# Patient Record
Sex: Female | Born: 1935 | Race: White | Hispanic: No | Smoking: Former smoker
Health system: Southern US, Community
[De-identification: ages and names within clinical notes are randomized; demographics above are authoritative.]

## PROBLEM LIST (undated history)

## (undated) DIAGNOSIS — M1990 Unspecified osteoarthritis, unspecified site: Secondary | ICD-10-CM

## (undated) DIAGNOSIS — C439 Malignant melanoma of skin, unspecified: Secondary | ICD-10-CM

## (undated) DIAGNOSIS — E7800 Pure hypercholesterolemia, unspecified: Secondary | ICD-10-CM

## (undated) DIAGNOSIS — C801 Malignant (primary) neoplasm, unspecified: Secondary | ICD-10-CM

## (undated) DIAGNOSIS — E049 Nontoxic goiter, unspecified: Secondary | ICD-10-CM

## (undated) DIAGNOSIS — I10 Essential (primary) hypertension: Secondary | ICD-10-CM

## (undated) DIAGNOSIS — F32A Depression, unspecified: Secondary | ICD-10-CM

## (undated) DIAGNOSIS — I499 Cardiac arrhythmia, unspecified: Secondary | ICD-10-CM

## (undated) DIAGNOSIS — F329 Major depressive disorder, single episode, unspecified: Secondary | ICD-10-CM

## (undated) HISTORY — PX: WISDOM TOOTH EXTRACTION: SHX21

## (undated) HISTORY — PX: CATARACT EXTRACTION W/ INTRAOCULAR LENS IMPLANT: SHX1309

## (undated) HISTORY — PX: TONSILLECTOMY: SUR1361

## (undated) HISTORY — PX: COLONOSCOPY: SHX174

## (undated) HISTORY — PX: MELANOMA EXCISION: SHX5266

---

## 1990-04-10 DIAGNOSIS — C801 Malignant (primary) neoplasm, unspecified: Secondary | ICD-10-CM

## 1990-04-10 HISTORY — DX: Malignant (primary) neoplasm, unspecified: C80.1

## 1990-04-10 HISTORY — PX: BREAST LUMPECTOMY: SHX2

## 1998-05-11 ENCOUNTER — Other Ambulatory Visit: Admission: RE | Admit: 1998-05-11 | Discharge: 1998-05-11 | Payer: Self-pay | Admitting: Gynecology

## 1999-10-31 ENCOUNTER — Other Ambulatory Visit: Admission: RE | Admit: 1999-10-31 | Discharge: 1999-10-31 | Payer: Self-pay | Admitting: Gynecology

## 2000-04-10 HISTORY — PX: BACK SURGERY: SHX140

## 2000-06-22 ENCOUNTER — Encounter: Admission: RE | Admit: 2000-06-22 | Discharge: 2000-06-22 | Payer: Self-pay | Admitting: Specialist

## 2000-06-22 ENCOUNTER — Encounter: Payer: Self-pay | Admitting: Specialist

## 2001-02-04 ENCOUNTER — Other Ambulatory Visit: Admission: RE | Admit: 2001-02-04 | Discharge: 2001-02-04 | Payer: Self-pay | Admitting: Gynecology

## 2001-07-19 ENCOUNTER — Encounter: Payer: Self-pay | Admitting: Internal Medicine

## 2001-07-19 ENCOUNTER — Encounter: Admission: RE | Admit: 2001-07-19 | Discharge: 2001-07-19 | Payer: Self-pay | Admitting: Internal Medicine

## 2002-02-06 ENCOUNTER — Other Ambulatory Visit: Admission: RE | Admit: 2002-02-06 | Discharge: 2002-02-06 | Payer: Self-pay | Admitting: Gynecology

## 2002-02-19 ENCOUNTER — Encounter: Payer: Self-pay | Admitting: Internal Medicine

## 2002-02-19 ENCOUNTER — Ambulatory Visit (HOSPITAL_COMMUNITY): Admission: RE | Admit: 2002-02-19 | Discharge: 2002-02-19 | Payer: Self-pay | Admitting: Internal Medicine

## 2002-06-20 ENCOUNTER — Ambulatory Visit (HOSPITAL_COMMUNITY): Admission: RE | Admit: 2002-06-20 | Discharge: 2002-06-20 | Payer: Self-pay | Admitting: *Deleted

## 2003-03-17 ENCOUNTER — Other Ambulatory Visit: Admission: RE | Admit: 2003-03-17 | Discharge: 2003-03-17 | Payer: Self-pay | Admitting: Gynecology

## 2005-07-05 ENCOUNTER — Encounter: Admission: RE | Admit: 2005-07-05 | Discharge: 2005-07-05 | Payer: Self-pay | Admitting: Internal Medicine

## 2006-05-05 ENCOUNTER — Encounter: Admission: RE | Admit: 2006-05-05 | Discharge: 2006-05-05 | Payer: Self-pay | Admitting: Internal Medicine

## 2007-11-01 ENCOUNTER — Ambulatory Visit (HOSPITAL_COMMUNITY): Admission: RE | Admit: 2007-11-01 | Discharge: 2007-11-01 | Payer: Self-pay | Admitting: *Deleted

## 2007-11-01 ENCOUNTER — Encounter (INDEPENDENT_AMBULATORY_CARE_PROVIDER_SITE_OTHER): Payer: Self-pay | Admitting: *Deleted

## 2009-06-15 ENCOUNTER — Encounter: Admission: RE | Admit: 2009-06-15 | Discharge: 2009-06-15 | Payer: Self-pay | Admitting: Internal Medicine

## 2009-07-06 ENCOUNTER — Other Ambulatory Visit: Admission: RE | Admit: 2009-07-06 | Discharge: 2009-07-06 | Payer: Self-pay | Admitting: Interventional Radiology

## 2009-07-06 ENCOUNTER — Encounter: Admission: RE | Admit: 2009-07-06 | Discharge: 2009-07-06 | Payer: Self-pay | Admitting: Internal Medicine

## 2009-08-03 ENCOUNTER — Ambulatory Visit (HOSPITAL_COMMUNITY): Admission: RE | Admit: 2009-08-03 | Discharge: 2009-08-03 | Payer: Self-pay | Admitting: Endocrinology

## 2010-05-04 ENCOUNTER — Other Ambulatory Visit: Payer: Self-pay | Admitting: Dermatology

## 2010-08-04 ENCOUNTER — Other Ambulatory Visit: Payer: Self-pay | Admitting: Endocrinology

## 2010-08-04 DIAGNOSIS — E049 Nontoxic goiter, unspecified: Secondary | ICD-10-CM

## 2010-08-08 ENCOUNTER — Ambulatory Visit
Admission: RE | Admit: 2010-08-08 | Discharge: 2010-08-08 | Disposition: A | Discharge status: Home / Self Care | Payer: Medicare Other | Source: Ambulatory Visit | Attending: Endocrinology | Admitting: Endocrinology

## 2010-08-08 DIAGNOSIS — E049 Nontoxic goiter, unspecified: Secondary | ICD-10-CM

## 2010-08-23 NOTE — Op Note (Signed)
NAME:  Andrea Vasquez, Andrea Vasquez           ACCOUNT NO.:  [id number]   MEDICAL RECORD NO.:  [id number]          PATIENT TYPE:  AMB   LOCATION:  ENDO                         FACILITY:  [facility]   PHYSICIAN:  Georgiana Spinner, M.D.    DATE OF BIRTH:  [birth year]   DATE OF PROCEDURE:  DATE OF DISCHARGE:                               OPERATIVE REPORT   PROCEDURE:  Colonoscopy.   INDICATIONS:  Colon cancer screening.   ANESTHESIA:  Fentanyl 75 mcg, Versed 7.5 mg.   DESCRIPTION OF PROCEDURE:  With the patient mildly sedated in the left  lateral decubitus position, the Pentax videoscopic colonoscope was  inserted in the rectum and passed under direct vision to the cecum,  identified by the ileocecal valve and appendiceal orifice, both of which  were photographed.  From this point, the colonoscope was slowly  withdrawn, taking circumferential views of the colonic mucosa as we  passed through the sigmoid colon, which was thickened with  diverticulosis until we reached the rectum which showed a small polyp  which was photographed and removed using biopsy forceps.  We then placed  the endoscope in retroflexed view to view the anal canal from above.  Internal hemorrhoids were noted.  The endoscope was straightened and  withdrawn.  The patient's vital signs and pulse oximeter remained  stable.  The patient tolerated the procedure well without apparent  complications.   FINDINGS:  Thickening of the sigmoid colon with diverticulosis noted.  Internal hemorrhoids.  Small polyp in the rectum, biopsied.  Await  biopsy report.  The patient will call me for results and follow-up with  me as needed as an outpatient.           ______________________________  Georgiana Spinner, M.D.     GMO/MEDQ  D:  11/01/2007  T:  11/01/2007  Job:  [id number]

## 2010-08-26 NOTE — Op Note (Signed)
   NAME:  Andrea Vasquez, Andrea Vasquez                     ACCOUNT NO.:  [id number]   MEDICAL RECORD NO.:  [id number]                   PATIENT TYPE:  AMB   LOCATION:  ENDO                                 FACILITY:  [facility]   PHYSICIAN:  Georgiana Spinner, M.D.                 DATE OF BIRTH:  [birth year]   DATE OF PROCEDURE:  DATE OF DISCHARGE:                                 OPERATIVE REPORT   PROCEDURE:  Colonoscopy.   INDICATIONS FOR PROCEDURE:  Colon cancer screening.   ANESTHESIA:  Demerol 50, Versed 6 mg.   DESCRIPTION OF PROCEDURE:  With the patient mildly sedated in the left  lateral decubitus position, the Olympus videoscopic colonoscope was inserted  in the rectum and passed under direct vision to the cecum identified by the  ileocecal valve and appendiceal orifice both of which were photographed.  From this point, the colonoscope was slowly withdrawn taking circumferential  views of the entire colonic mucosa stopping only to photograph diverticula  along the way until we reached the rectum which appeared normal on direct  and showed hemorrhoids on retroflexed view. The endoscope was straightened  and withdrawn. The patient's vital signs and pulse oximeter remained stable.  The patient tolerated the procedure well without apparent complications.   FINDINGS:  Internal hemorrhoids, diverticulosis of the sigmoid colon;  otherwise, unremarkable examination.   PLAN:  Repeat examination in approximately five years.                                                 Georgiana Spinner, M.D.    GMO/MEDQ  D:  06/20/2002  T:  06/20/2002  Job:  [id number]

## 2011-06-01 ENCOUNTER — Other Ambulatory Visit: Payer: Self-pay | Admitting: Internal Medicine

## 2011-06-01 DIAGNOSIS — E042 Nontoxic multinodular goiter: Secondary | ICD-10-CM

## 2011-07-24 ENCOUNTER — Ambulatory Visit
Admission: RE | Admit: 2011-07-24 | Discharge: 2011-07-24 | Disposition: A | Discharge status: Home / Self Care | Payer: Medicare Other | Source: Ambulatory Visit | Attending: Internal Medicine | Admitting: Internal Medicine

## 2011-07-24 DIAGNOSIS — E042 Nontoxic multinodular goiter: Secondary | ICD-10-CM

## 2011-08-04 ENCOUNTER — Other Ambulatory Visit: Payer: Medicare Other

## 2011-11-27 ENCOUNTER — Other Ambulatory Visit: Payer: Self-pay

## 2012-03-13 ENCOUNTER — Other Ambulatory Visit: Payer: Self-pay | Admitting: Dermatology

## 2012-06-17 ENCOUNTER — Other Ambulatory Visit: Payer: Self-pay | Admitting: Dermatology

## 2012-08-01 ENCOUNTER — Other Ambulatory Visit: Payer: Self-pay | Admitting: Gastroenterology

## 2012-09-11 ENCOUNTER — Other Ambulatory Visit: Payer: Self-pay | Admitting: Endocrinology

## 2012-09-11 DIAGNOSIS — E049 Nontoxic goiter, unspecified: Secondary | ICD-10-CM

## 2013-03-20 ENCOUNTER — Other Ambulatory Visit: Payer: Self-pay | Admitting: Dermatology

## 2013-03-24 ENCOUNTER — Ambulatory Visit
Admission: RE | Admit: 2013-03-24 | Discharge: 2013-03-24 | Disposition: A | Discharge status: Home / Self Care | Payer: Medicare PPO | Source: Ambulatory Visit | Attending: Endocrinology | Admitting: Endocrinology

## 2013-03-24 DIAGNOSIS — E049 Nontoxic goiter, unspecified: Secondary | ICD-10-CM

## 2013-04-22 ENCOUNTER — Other Ambulatory Visit: Payer: Self-pay | Admitting: Orthopedic Surgery

## 2013-05-06 ENCOUNTER — Encounter (HOSPITAL_COMMUNITY): Payer: Self-pay | Admitting: Pharmacy Technician

## 2013-05-08 ENCOUNTER — Encounter (HOSPITAL_COMMUNITY)
Admission: RE | Admit: 2013-05-08 | Discharge: 2013-05-08 | Disposition: A | Discharge status: Home / Self Care | Payer: Medicare Other | Source: Ambulatory Visit | Attending: Orthopedic Surgery | Admitting: Orthopedic Surgery

## 2013-05-08 ENCOUNTER — Encounter (HOSPITAL_COMMUNITY): Payer: Self-pay

## 2013-05-08 ENCOUNTER — Ambulatory Visit (HOSPITAL_COMMUNITY)
Admission: RE | Admit: 2013-05-08 | Discharge: 2013-05-08 | Disposition: A | Discharge status: Home / Self Care | Payer: Medicare Other | Source: Ambulatory Visit | Attending: Orthopedic Surgery | Admitting: Orthopedic Surgery

## 2013-05-08 DIAGNOSIS — M899 Disorder of bone, unspecified: Secondary | ICD-10-CM | POA: Insufficient documentation

## 2013-05-08 DIAGNOSIS — M47814 Spondylosis without myelopathy or radiculopathy, thoracic region: Secondary | ICD-10-CM | POA: Insufficient documentation

## 2013-05-08 DIAGNOSIS — M949 Disorder of cartilage, unspecified: Secondary | ICD-10-CM

## 2013-05-08 DIAGNOSIS — Z0183 Encounter for blood typing: Secondary | ICD-10-CM | POA: Insufficient documentation

## 2013-05-08 DIAGNOSIS — Z01818 Encounter for other preprocedural examination: Secondary | ICD-10-CM | POA: Insufficient documentation

## 2013-05-08 DIAGNOSIS — Z01812 Encounter for preprocedural laboratory examination: Secondary | ICD-10-CM | POA: Insufficient documentation

## 2013-05-08 DIAGNOSIS — M1610 Unilateral primary osteoarthritis, unspecified hip: Secondary | ICD-10-CM | POA: Insufficient documentation

## 2013-05-08 DIAGNOSIS — M169 Osteoarthritis of hip, unspecified: Secondary | ICD-10-CM | POA: Insufficient documentation

## 2013-05-08 DIAGNOSIS — M76899 Other specified enthesopathies of unspecified lower limb, excluding foot: Secondary | ICD-10-CM | POA: Insufficient documentation

## 2013-05-08 HISTORY — DX: Nontoxic goiter, unspecified: E04.9

## 2013-05-08 HISTORY — DX: Unspecified osteoarthritis, unspecified site: M19.90

## 2013-05-08 HISTORY — DX: Depression, unspecified: F32.A

## 2013-05-08 HISTORY — DX: Cardiac arrhythmia, unspecified: I49.9

## 2013-05-08 HISTORY — DX: Pure hypercholesterolemia, unspecified: E78.00

## 2013-05-08 HISTORY — DX: Malignant (primary) neoplasm, unspecified: C80.1

## 2013-05-08 HISTORY — DX: Essential (primary) hypertension: I10

## 2013-05-08 HISTORY — DX: Major depressive disorder, single episode, unspecified: F32.9

## 2013-05-08 LAB — COMPREHENSIVE METABOLIC PANEL
ALK PHOS: 68 U/L (ref 39–117)
ALT: 23 U/L (ref 0–35)
AST: 17 U/L (ref 0–37)
Albumin: 3.9 g/dL (ref 3.5–5.2)
BILIRUBIN TOTAL: 0.6 mg/dL (ref 0.3–1.2)
BUN: 11 mg/dL (ref 6–23)
CHLORIDE: 103 meq/L (ref 96–112)
CO2: 27 meq/L (ref 19–32)
Calcium: 9.7 mg/dL (ref 8.4–10.5)
Creatinine, Ser: 0.53 mg/dL (ref 0.50–1.10)
GFR calc Af Amer: >90 mL/min (ref >90)
GFR calc non Af Amer: >90 mL/min (ref >90)
GLUCOSE: 99 mg/dL (ref 70–99)
POTASSIUM: 4.1 meq/L (ref 3.7–5.3)
Sodium: 142 mEq/L (ref 137–147)
Total Protein: 7.4 g/dL (ref 6.0–8.3)

## 2013-05-08 LAB — CBC
HCT: 44.1 % (ref 36.0–46.0)
Hemoglobin: 14.2 g/dL (ref 12.0–15.0)
MCH: 31.4 pg (ref 26.0–34.0)
MCHC: 32.2 g/dL (ref 30.0–36.0)
MCV: 97.6 fL (ref 78.0–100.0)
Platelets: 321 10*3/uL (ref 150–400)
RBC: 4.52 MIL/uL (ref 3.87–5.11)
RDW: 12.8 % (ref 11.5–15.5)
WBC: 10.4 10*3/uL (ref 4.0–10.5)

## 2013-05-08 LAB — PROTIME-INR
INR: 0.92 (ref 0.00–1.49)
PROTHROMBIN TIME: 12.2 s (ref 11.6–15.2)

## 2013-05-08 LAB — ABO/RH: ABO/RH(D): O POS

## 2013-05-08 LAB — URINALYSIS, ROUTINE W REFLEX MICROSCOPIC
Bilirubin Urine: NEGATIVE
Glucose, UA: NEGATIVE mg/dL
Hgb urine dipstick: NEGATIVE
Ketones, ur: NEGATIVE mg/dL
LEUKOCYTES UA: NEGATIVE
NITRITE: NEGATIVE
PH: 5.5 (ref 5.0–8.0)
Protein, ur: NEGATIVE mg/dL
SPECIFIC GRAVITY, URINE: 1.02 (ref 1.005–1.030)
Urobilinogen, UA: 0.2 mg/dL (ref 0.0–1.0)

## 2013-05-08 LAB — SURGICAL PCR SCREEN
MRSA, PCR: NEGATIVE
Staphylococcus aureus: NEGATIVE

## 2013-05-08 LAB — APTT: APTT: 31 s (ref 24–37)

## 2013-05-08 NOTE — Progress Notes (Signed)
EKG 02/27/13 on chart, surgery clearance note 02/27/13 Dr. Shelia Media on chart

## 2013-05-08 NOTE — Patient Instructions (Addendum)
Andrea Vasquez  05/08/2013   Your procedure is scheduled on: 05/14/13  Report to [facility] at 11:00 AM.  Call this number if you have problems the morning of surgery 336-: [phone]   Remember: NO VISITORS UNDER AGE 75 PER [place].   Do not eat food After Midnight, clear liquids from midnight until 7:30am on 05/14/13 then nothing.    Do not wear jewelry, make-up or nail polish.  Do not wear lotions, powders, or perfumes. You may wear deodorant.  Do not shave 48 hours prior to surgery. Men may shave face and neck.  Do not bring valuables to the hospital.  Contacts, dentures or bridgework may not be worn into surgery.  Leave suitcase in the car. After surgery it may be brought to your room.  For patients admitted to the hospital, checkout time is 11:00 AM the day of discharge.   Please read over the following fact sheets that you were given: MRSA Information, incentive spirometry fact sheet, blood fact sheet Paulette Blanch, RN  pre op nurse call if needed [phone]    FAILURE TO [place]   Patient Signature: ___________________________________________

## 2013-05-09 ENCOUNTER — Other Ambulatory Visit: Payer: Self-pay | Admitting: Surgical

## 2013-05-09 NOTE — H&P (Signed)
TOTAL HIP ADMISSION H&P  Patient is admitted for right total hip arthroplasty.  Subjective:  Chief Complaint: right hip pain  HPI: [place], 78 y.o. female, has a history of pain and functional disability in the right hip(s) due to arthritis and patient has failed non-surgical conservative treatments for greater than 12 weeks to include NSAID's and/or analgesics, corticosteriod injections, use of assistive devices and activity modification.  Onset of symptoms was gradual starting 2 years ago with gradually worsening course since that time.The patient noted no past surgery on the right hip(s).  Patient currently rates pain in the right hip at 7 out of 10 with activity. Patient has night pain, worsening of pain with activity and weight bearing, pain that interfers with activities of daily living, pain with passive range of motion and crepitus. Patient has evidence of periarticular osteophytes and joint space narrowing by imaging studies. This condition presents safety issues increasing the risk of falls.  There is no current active infection.  Past Medical History  Diagnosis Date  . Hypercholesteremia     controlled by medicine  . Hypertension   . Goiter     small on right side, monitored  . Depression     mild  . Arthritis   . Cancer 1992    right breast  . Dysrhythmia     "heart fluttering-been checked out, nothing wrong"    Past Surgical History  Procedure Laterality Date  . Breast lumpectomy Right 1992  . Back surgery  2002    lumbar, herniated disc  . Tonsillectomy  age 46     Current outpatient prescriptions: acetaminophen (TYLENOL) 650 MG CR tablet, Take 1,300 mg by mouth 2 (two) times daily., Disp: , Rfl: ;   losartan (COZAAR) 100 MG tablet, Take 100 mg by mouth every evening., Disp: , Rfl: ;   mirtazapine (REMERON) 15 MG tablet, Take 15 mg by mouth at bedtime., Disp: , Rfl: ;   Multiple Vitamins-Minerals (PRESERVISION AREDS PO), Take 1 tablet by mouth daily.,  Disp: , Rfl:  rosuvastatin (CRESTOR) 5 MG tablet, Take 5 mg by mouth every evening., Disp: , Rfl: ;   traMADol (ULTRAM) 50 MG tablet, Take 50 mg by mouth every 6 (six) hours as needed for moderate pain., Disp: , Rfl:   No Known Allergies  History  Substance Use Topics  . Smoking status: Former Smoker -- 0.25 packs/day for 10 years    Types: Cigarettes    Quit date: 04/10/1968  . Smokeless tobacco: Never Used  . Alcohol Use: Yes     Comment: glass of wine occasional    Family History Father: deceased from cancer Mother: deceased from cancer Aunt: deceased from cancer Sister: deceased from cancer  Review of Systems  Constitutional: Negative.   HENT: Negative.   Eyes: Negative.   Respiratory: Negative.   Cardiovascular: Negative.   Gastrointestinal: Negative.   Genitourinary: Positive for frequency. Negative for dysuria, urgency, hematuria and flank pain.  Musculoskeletal: Positive for joint pain. Negative for back pain, falls, myalgias and neck pain.       Right hip pain  Skin: Negative.   Neurological: Negative.   Endo/Heme/Allergies: Negative.   Psychiatric/Behavioral: Negative.     Objective:  Physical Exam  Constitutional: She is oriented to person, place, and time. She appears well-developed and well-nourished. No distress.  HENT:  Head: Normocephalic and atraumatic.  Right Ear: External ear normal.  Left Ear: External ear normal.  Nose: Nose normal.  Mouth/Throat: Oropharynx is clear and  moist.  Eyes: Conjunctivae and EOM are normal.  Neck: Normal range of motion. Neck supple.  Respiratory: Effort normal and breath sounds normal. No respiratory distress. She has no wheezes.  GI: Bowel sounds are normal. She exhibits no distension. There is no tenderness.  Musculoskeletal:       Right hip: She exhibits decreased range of motion, decreased strength and crepitus.       Left hip: Normal.       Right knee: Normal.       Left knee: Normal.       Right lower leg:  She exhibits no tenderness and no swelling.       Left lower leg: She exhibits no tenderness and no swelling.   Her left hip has normal range of motion without discomfort. Right hip flexion 90, no internal rotation, about 10 degrees of external rotation, 10-20 degrees of abduction. She does have pain on range of motion of that right hip.  Neurological: She is alert and oriented to person, place, and time. She has normal strength and normal reflexes. No sensory deficit.  Skin: No rash noted. She is not diaphoretic. No erythema.  Psychiatric: She has a normal mood and affect. Her behavior is normal.    Vitals Weight: 140 lb Height: 70 in Body Surface Area: 1.77 m Body Mass Index: 20.09 kg/m Pulse: 72 (Regular) BP: 124/87 (Sitting, Left Arm, Standard)  Imaging Review Plain radiographs demonstrate severe degenerative joint disease of the right hip(s). The bone quality appears to be good for age and reported activity level.  Assessment/Plan:  End stage arthritis, right hip(s)  The patient history, physical examination, clinical judgement of the provider and imaging studies are consistent with end stage degenerative joint disease of the right hip(s) and total hip arthroplasty is deemed medically necessary. The treatment options including medical management, injection therapy, arthroscopy and arthroplasty were discussed at length. The risks and benefits of total hip arthroplasty were presented and reviewed. The risks due to aseptic loosening, infection, stiffness, dislocation/subluxation,  thromboembolic complications and other imponderables were discussed.  The patient acknowledged the explanation, agreed to proceed with the plan and consent was signed. Patient is being admitted for inpatient treatment for surgery, pain control, PT, OT, prophylactic antibiotics, VTE prophylaxis, progressive ambulation and ADL's and discharge planning.The patient is planning to be discharged home with  home health services      [place], [place]

## 2013-05-13 DIAGNOSIS — M169 Osteoarthritis of hip, unspecified: Secondary | ICD-10-CM | POA: Diagnosis present

## 2013-05-14 ENCOUNTER — Encounter (HOSPITAL_COMMUNITY): Payer: Self-pay | Admitting: *Deleted

## 2013-05-14 ENCOUNTER — Inpatient Hospital Stay (HOSPITAL_COMMUNITY): Payer: Medicare Other

## 2013-05-14 ENCOUNTER — Encounter (HOSPITAL_COMMUNITY): Payer: Medicare Other | Admitting: Anesthesiology

## 2013-05-14 ENCOUNTER — Inpatient Hospital Stay (HOSPITAL_COMMUNITY)
Admission: RE | Admit: 2013-05-14 | Discharge: 2013-05-16 | DRG: 470 | Disposition: A | Discharge status: Home Health | Payer: Medicare Other | Source: Ambulatory Visit | Attending: Orthopedic Surgery | Admitting: Orthopedic Surgery

## 2013-05-14 ENCOUNTER — Inpatient Hospital Stay (HOSPITAL_COMMUNITY): Payer: Medicare Other | Admitting: Anesthesiology

## 2013-05-14 ENCOUNTER — Encounter (HOSPITAL_COMMUNITY)
Admission: RE | Disposition: A | Discharge status: Home Health | Payer: Self-pay | Source: Ambulatory Visit | Attending: Orthopedic Surgery

## 2013-05-14 DIAGNOSIS — E876 Hypokalemia: Secondary | ICD-10-CM | POA: Diagnosis not present

## 2013-05-14 DIAGNOSIS — I10 Essential (primary) hypertension: Secondary | ICD-10-CM | POA: Diagnosis present

## 2013-05-14 DIAGNOSIS — M169 Osteoarthritis of hip, unspecified: Secondary | ICD-10-CM | POA: Diagnosis present

## 2013-05-14 DIAGNOSIS — Z96649 Presence of unspecified artificial hip joint: Secondary | ICD-10-CM

## 2013-05-14 DIAGNOSIS — F329 Major depressive disorder, single episode, unspecified: Secondary | ICD-10-CM | POA: Diagnosis present

## 2013-05-14 DIAGNOSIS — E7800 Pure hypercholesterolemia, unspecified: Secondary | ICD-10-CM | POA: Diagnosis present

## 2013-05-14 DIAGNOSIS — F3289 Other specified depressive episodes: Secondary | ICD-10-CM | POA: Diagnosis present

## 2013-05-14 DIAGNOSIS — IMO0002 Reserved for concepts with insufficient information to code with codable children: Secondary | ICD-10-CM

## 2013-05-14 DIAGNOSIS — Z87891 Personal history of nicotine dependence: Secondary | ICD-10-CM

## 2013-05-14 DIAGNOSIS — M1610 Unilateral primary osteoarthritis, unspecified hip: Principal | ICD-10-CM | POA: Diagnosis present

## 2013-05-14 DIAGNOSIS — D62 Acute posthemorrhagic anemia: Secondary | ICD-10-CM | POA: Diagnosis not present

## 2013-05-14 HISTORY — PX: TOTAL HIP ARTHROPLASTY: SHX124

## 2013-05-14 LAB — TYPE AND SCREEN
ABO/RH(D): O POS
Antibody Screen: NEGATIVE

## 2013-05-14 SURGERY — ARTHROPLASTY, HIP, TOTAL, ANTERIOR APPROACH
Anesthesia: Spinal | Site: Hip | Laterality: Right

## 2013-05-14 MED ORDER — PROMETHAZINE HCL 25 MG/ML IJ SOLN: 6.2500 mg | INTRAMUSCULAR | Status: DC | PRN

## 2013-05-14 MED ORDER — DIPHENHYDRAMINE HCL 12.5 MG/5ML PO ELIX: 12.5000 mg | ORAL_SOLUTION | ORAL | Status: DC | PRN

## 2013-05-14 MED ORDER — SODIUM CHLORIDE 0.9 % IJ SOLN
INTRAMUSCULAR | Status: AC
  Filled 2013-05-14: qty 50

## 2013-05-14 MED ORDER — PHENOL 1.4 % MT LIQD
1.0000 | OROMUCOSAL | Status: DC | PRN
  Filled 2013-05-14: qty 177

## 2013-05-14 MED ORDER — FLEET ENEMA 7-19 GM/118ML RE ENEM: 1.0000 | ENEMA | Freq: Once | RECTAL | Status: AC | PRN

## 2013-05-14 MED ORDER — SODIUM CHLORIDE 0.9 % IJ SOLN
INTRAMUSCULAR | Status: DC | PRN
  Administered 2013-05-14: 14:00:00

## 2013-05-14 MED ORDER — ACETAMINOPHEN 650 MG RE SUPP: 650.0000 mg | Freq: Four times a day (QID) | RECTAL | Status: DC | PRN

## 2013-05-14 MED ORDER — POLYETHYLENE GLYCOL 3350 17 G PO PACK
17.0000 g | PACK | Freq: Every day | ORAL | Status: DC | PRN
Start: 2013-05-14 — End: 2013-05-16

## 2013-05-14 MED ORDER — CEFAZOLIN SODIUM-DEXTROSE 2-3 GM-% IV SOLR
2.0000 g | INTRAVENOUS | Status: AC
  Administered 2013-05-14: 2 g via INTRAVENOUS

## 2013-05-14 MED ORDER — PROPOFOL 10 MG/ML IV BOLUS
INTRAVENOUS | Status: AC
  Filled 2013-05-14: qty 20

## 2013-05-14 MED ORDER — BISACODYL 10 MG RE SUPP: 10.0000 mg | Freq: Every day | RECTAL | Status: DC | PRN

## 2013-05-14 MED ORDER — DEXTROSE-NACL 5-0.45 % IV SOLN
INTRAVENOUS | Status: DC
  Administered 2013-05-14: 75 mL/h via INTRAVENOUS

## 2013-05-14 MED ORDER — LOSARTAN POTASSIUM 50 MG PO TABS: 100.0000 mg | ORAL_TABLET | Freq: Every evening | ORAL | Status: DC

## 2013-05-14 MED ORDER — MIDAZOLAM HCL 5 MG/5ML IJ SOLN
INTRAMUSCULAR | Status: DC | PRN
  Administered 2013-05-14 (×2): 1 mg via INTRAVENOUS

## 2013-05-14 MED ORDER — OXYCODONE HCL 5 MG PO TABS
5.0000 mg | ORAL_TABLET | ORAL | Status: DC | PRN
  Administered 2013-05-14: 5 mg via ORAL
  Administered 2013-05-14: 10 mg via ORAL
  Administered 2013-05-14 – 2013-05-15 (×4): 5 mg via ORAL
  Administered 2013-05-15 – 2013-05-16 (×2): 10 mg via ORAL
  Administered 2013-05-16 (×2): 5 mg via ORAL
  Administered 2013-05-16: 10 mg via ORAL
  Filled 2013-05-14 (×13): qty 1
  Filled 2013-05-14: qty 2

## 2013-05-14 MED ORDER — LACTATED RINGERS IV SOLN
INTRAVENOUS | Status: DC | PRN
  Administered 2013-05-14: 1000 mL
  Administered 2013-05-14 (×2): via INTRAVENOUS

## 2013-05-14 MED ORDER — BUPIVACAINE LIPOSOME 1.3 % IJ SUSP
20.0000 mL | Freq: Once | INTRAMUSCULAR | Status: DC
  Filled 2013-05-14: qty 20

## 2013-05-14 MED ORDER — CEFAZOLIN SODIUM 1-5 GM-% IV SOLN
1.0000 g | Freq: Four times a day (QID) | INTRAVENOUS | Status: AC
  Administered 2013-05-14 (×2): 1 g via INTRAVENOUS
  Filled 2013-05-14 (×2): qty 50

## 2013-05-14 MED ORDER — KETOROLAC TROMETHAMINE 15 MG/ML IJ SOLN: 7.5000 mg | Freq: Four times a day (QID) | INTRAMUSCULAR | Status: AC | PRN

## 2013-05-14 MED ORDER — DEXAMETHASONE 6 MG PO TABS
10.0000 mg | ORAL_TABLET | Freq: Every day | ORAL | Status: AC
  Administered 2013-05-15: 10 mg via ORAL
  Filled 2013-05-14: qty 1

## 2013-05-14 MED ORDER — MIRTAZAPINE 15 MG PO TABS
15.0000 mg | ORAL_TABLET | Freq: Every day | ORAL | Status: DC
  Administered 2013-05-14 – 2013-05-15 (×2): 15 mg via ORAL
  Filled 2013-05-14 (×3): qty 1

## 2013-05-14 MED ORDER — TRANEXAMIC ACID 100 MG/ML IV SOLN
1000.0000 mg | INTRAVENOUS | Status: AC
  Administered 2013-05-14: 1000 mg via INTRAVENOUS
  Filled 2013-05-14: qty 10

## 2013-05-14 MED ORDER — OXYCODONE HCL 5 MG PO TABS: 5.0000 mg | ORAL_TABLET | Freq: Once | ORAL | Status: DC | PRN

## 2013-05-14 MED ORDER — CHLORHEXIDINE GLUCONATE CLOTH 2 % EX PADS: 6.0000 | MEDICATED_PAD | Freq: Once | CUTANEOUS | Status: DC

## 2013-05-14 MED ORDER — BUPIVACAINE HCL (PF) 0.25 % IJ SOLN
INTRAMUSCULAR | Status: AC
  Filled 2013-05-14: qty 30

## 2013-05-14 MED ORDER — METOCLOPRAMIDE HCL 5 MG/ML IJ SOLN: 5.0000 mg | Freq: Three times a day (TID) | INTRAMUSCULAR | Status: DC | PRN

## 2013-05-14 MED ORDER — TRAMADOL HCL 50 MG PO TABS: 50.0000 mg | ORAL_TABLET | Freq: Four times a day (QID) | ORAL | Status: DC | PRN

## 2013-05-14 MED ORDER — BUPIVACAINE HCL (PF) 0.25 % IJ SOLN
INTRAMUSCULAR | Status: DC | PRN
  Administered 2013-05-14: 30 mL

## 2013-05-14 MED ORDER — BUPIVACAINE IN DEXTROSE 0.75-8.25 % IT SOLN
INTRATHECAL | Status: DC | PRN
  Administered 2013-05-14: 1.6 mL via INTRATHECAL

## 2013-05-14 MED ORDER — ACETAMINOPHEN 10 MG/ML IV SOLN
1000.0000 mg | Freq: Once | INTRAVENOUS | Status: AC
  Administered 2013-05-14: 1000 mg via INTRAVENOUS
  Filled 2013-05-14: qty 100

## 2013-05-14 MED ORDER — PHENYLEPHRINE HCL 10 MG/ML IJ SOLN
INTRAMUSCULAR | Status: DC | PRN
  Administered 2013-05-14 (×3): 40 ug via INTRAVENOUS

## 2013-05-14 MED ORDER — PROPOFOL INFUSION 10 MG/ML OPTIME
INTRAVENOUS | Status: DC | PRN
  Administered 2013-05-14: 100 ug/kg/min via INTRAVENOUS

## 2013-05-14 MED ORDER — DEXAMETHASONE SODIUM PHOSPHATE 10 MG/ML IJ SOLN
10.0000 mg | Freq: Once | INTRAMUSCULAR | Status: AC
  Administered 2013-05-14: 10 mg via INTRAVENOUS

## 2013-05-14 MED ORDER — ONDANSETRON HCL 4 MG/2ML IJ SOLN: 4.0000 mg | Freq: Four times a day (QID) | INTRAMUSCULAR | Status: DC | PRN

## 2013-05-14 MED ORDER — RIVAROXABAN 10 MG PO TABS
10.0000 mg | ORAL_TABLET | Freq: Every day | ORAL | Status: DC
  Administered 2013-05-15 – 2013-05-16 (×2): 10 mg via ORAL
  Filled 2013-05-14 (×3): qty 1

## 2013-05-14 MED ORDER — ACETAMINOPHEN 500 MG PO TABS
1000.0000 mg | ORAL_TABLET | Freq: Four times a day (QID) | ORAL | Status: AC
  Administered 2013-05-14 – 2013-05-15 (×4): 1000 mg via ORAL
  Filled 2013-05-14 (×4): qty 2

## 2013-05-14 MED ORDER — LIDOCAINE HCL (CARDIAC) 20 MG/ML IV SOLN
INTRAVENOUS | Status: AC
  Filled 2013-05-14: qty 5

## 2013-05-14 MED ORDER — ONDANSETRON HCL 4 MG/2ML IJ SOLN
INTRAMUSCULAR | Status: AC
  Filled 2013-05-14: qty 2

## 2013-05-14 MED ORDER — METHOCARBAMOL 100 MG/ML IJ SOLN
500.0000 mg | Freq: Four times a day (QID) | INTRAMUSCULAR | Status: DC | PRN
  Administered 2013-05-14: 500 mg via INTRAVENOUS
  Filled 2013-05-14: qty 5

## 2013-05-14 MED ORDER — ONDANSETRON HCL 4 MG/2ML IJ SOLN
INTRAMUSCULAR | Status: DC | PRN
  Administered 2013-05-14: 4 mg via INTRAVENOUS

## 2013-05-14 MED ORDER — OXYCODONE HCL 5 MG/5ML PO SOLN
5.0000 mg | Freq: Once | ORAL | Status: DC | PRN
  Filled 2013-05-14: qty 5

## 2013-05-14 MED ORDER — ONDANSETRON HCL 4 MG PO TABS
4.0000 mg | ORAL_TABLET | Freq: Four times a day (QID) | ORAL | Status: DC | PRN
  Administered 2013-05-16: 4 mg via ORAL
  Filled 2013-05-14: qty 1

## 2013-05-14 MED ORDER — 0.9 % SODIUM CHLORIDE (POUR BTL) OPTIME
TOPICAL | Status: DC | PRN
  Administered 2013-05-14: 1000 mL

## 2013-05-14 MED ORDER — LOSARTAN POTASSIUM 50 MG PO TABS
100.0000 mg | ORAL_TABLET | Freq: Every evening | ORAL | Status: DC
  Administered 2013-05-14: 100 mg via ORAL
  Filled 2013-05-14 (×3): qty 2

## 2013-05-14 MED ORDER — DEXAMETHASONE SODIUM PHOSPHATE 10 MG/ML IJ SOLN
INTRAMUSCULAR | Status: AC
Start: 2013-05-14 — End: 2013-05-14
  Filled 2013-05-14: qty 1

## 2013-05-14 MED ORDER — DOCUSATE SODIUM 100 MG PO CAPS
100.0000 mg | ORAL_CAPSULE | Freq: Two times a day (BID) | ORAL | Status: DC
  Administered 2013-05-14 – 2013-05-16 (×4): 100 mg via ORAL

## 2013-05-14 MED ORDER — METHOCARBAMOL 500 MG PO TABS
500.0000 mg | ORAL_TABLET | Freq: Four times a day (QID) | ORAL | Status: DC | PRN
Start: 2013-05-14 — End: 2013-05-16
  Administered 2013-05-14 – 2013-05-15 (×2): 500 mg via ORAL
  Filled 2013-05-14 (×2): qty 1

## 2013-05-14 MED ORDER — DEXAMETHASONE SODIUM PHOSPHATE 10 MG/ML IJ SOLN
10.0000 mg | Freq: Every day | INTRAMUSCULAR | Status: AC
  Filled 2013-05-14: qty 1

## 2013-05-14 MED ORDER — MEPERIDINE HCL 50 MG/ML IJ SOLN: 6.2500 mg | INTRAMUSCULAR | Status: DC | PRN

## 2013-05-14 MED ORDER — ACETAMINOPHEN 325 MG PO TABS: 650.0000 mg | ORAL_TABLET | Freq: Four times a day (QID) | ORAL | Status: DC | PRN

## 2013-05-14 MED ORDER — MIDAZOLAM HCL 2 MG/2ML IJ SOLN
INTRAMUSCULAR | Status: AC
  Filled 2013-05-14: qty 2

## 2013-05-14 MED ORDER — KETAMINE HCL 10 MG/ML IJ SOLN
INTRAMUSCULAR | Status: DC | PRN
  Administered 2013-05-14 (×4): 5 mg via INTRAVENOUS

## 2013-05-14 MED ORDER — METOCLOPRAMIDE HCL 10 MG PO TABS: 5.0000 mg | ORAL_TABLET | Freq: Three times a day (TID) | ORAL | Status: DC | PRN

## 2013-05-14 MED ORDER — PROPOFOL 10 MG/ML IV BOLUS
INTRAVENOUS | Status: AC
Start: 2013-05-14 — End: 2013-05-14
  Filled 2013-05-14: qty 20

## 2013-05-14 MED ORDER — CEFAZOLIN SODIUM-DEXTROSE 2-3 GM-% IV SOLR
INTRAVENOUS | Status: AC
  Filled 2013-05-14: qty 50

## 2013-05-14 MED ORDER — MORPHINE SULFATE 2 MG/ML IJ SOLN: 1.0000 mg | INTRAMUSCULAR | Status: DC | PRN

## 2013-05-14 MED ORDER — HYDROMORPHONE HCL PF 1 MG/ML IJ SOLN: 0.2500 mg | INTRAMUSCULAR | Status: DC | PRN

## 2013-05-14 MED ORDER — SODIUM CHLORIDE 0.9 % IV SOLN: INTRAVENOUS | Status: DC

## 2013-05-14 MED ORDER — MENTHOL 3 MG MT LOZG
1.0000 | LOZENGE | OROMUCOSAL | Status: DC | PRN
  Filled 2013-05-14: qty 9

## 2013-05-14 SURGICAL SUPPLY — 41 items
BAG ZIPLOCK 12X15 (MISCELLANEOUS) IMPLANT
BLADE SAW SGTL 18X1.27X75 (BLADE) ×2 IMPLANT
BLADE SAW SGTL 18X1.27X75MM (BLADE) ×1
CAPT HIP PF MOP ×3 IMPLANT
CLOSURE WOUND 1/2 X4 (GAUZE/BANDAGES/DRESSINGS) ×1
DECANTER SPIKE VIAL GLASS SM (MISCELLANEOUS) ×3 IMPLANT
DRAPE C-ARM 42X120 X-RAY (DRAPES) ×3 IMPLANT
DRAPE STERI IOBAN 125X83 (DRAPES) ×3 IMPLANT
DRAPE U-SHAPE 47X51 STRL (DRAPES) ×9 IMPLANT
DRSG ADAPTIC 3X8 NADH LF (GAUZE/BANDAGES/DRESSINGS) ×3 IMPLANT
DRSG MEPILEX BORDER 4X4 (GAUZE/BANDAGES/DRESSINGS) ×3 IMPLANT
DRSG MEPILEX BORDER 4X8 (GAUZE/BANDAGES/DRESSINGS) ×3 IMPLANT
DURAPREP 26ML APPLICATOR (WOUND CARE) ×3 IMPLANT
ELECT BLADE 6.5 EXT (BLADE) ×3 IMPLANT
ELECT REM PT RETURN 9FT ADLT (ELECTROSURGICAL) ×3
ELECTRODE REM PT RTRN 9FT ADLT (ELECTROSURGICAL) ×1 IMPLANT
EVACUATOR 1/8 PVC DRAIN (DRAIN) ×3 IMPLANT
FACESHIELD LNG OPTICON STERILE (SAFETY) ×12 IMPLANT
GLOVE BIO SURGEON STRL SZ7.5 (GLOVE) ×3 IMPLANT
GLOVE BIO SURGEON STRL SZ8 (GLOVE) ×6 IMPLANT
GLOVE BIOGEL PI IND STRL 8 (GLOVE) ×2 IMPLANT
GLOVE BIOGEL PI INDICATOR 8 (GLOVE) ×4
GOWN STRL REUS W/TWL LRG LVL3 (GOWN DISPOSABLE) ×3 IMPLANT
GOWN STRL REUS W/TWL XL LVL3 (GOWN DISPOSABLE) ×3 IMPLANT
KIT BASIN OR (CUSTOM PROCEDURE TRAY) ×3 IMPLANT
NDL SAFETY ECLIPSE 18X1.5 (NEEDLE) ×2 IMPLANT
NEEDLE HYPO 18GX1.5 SHARP (NEEDLE) ×4
PACK TOTAL JOINT (CUSTOM PROCEDURE TRAY) ×3 IMPLANT
PADDING CAST COTTON 6X4 STRL (CAST SUPPLIES) ×3 IMPLANT
SPONGE GAUZE 4X4 12PLY (GAUZE/BANDAGES/DRESSINGS) IMPLANT
STRIP CLOSURE SKIN 1/2X4 (GAUZE/BANDAGES/DRESSINGS) ×2 IMPLANT
SUCTION FRAZIER 12FR DISP (SUCTIONS) IMPLANT
SUT ETHIBOND NAB CT1 #1 30IN (SUTURE) ×3 IMPLANT
SUT MNCRL AB 4-0 PS2 18 (SUTURE) ×3 IMPLANT
SUT VIC AB 2-0 CT1 27 (SUTURE) ×4
SUT VIC AB 2-0 CT1 TAPERPNT 27 (SUTURE) ×2 IMPLANT
SUT VLOC 180 0 24IN GS25 (SUTURE) ×3 IMPLANT
SYR 20CC LL (SYRINGE) ×3 IMPLANT
SYR 50ML LL SCALE MARK (SYRINGE) ×3 IMPLANT
TOWEL OR 17X26 10 PK STRL BLUE (TOWEL DISPOSABLE) ×3 IMPLANT
TRAY FOLEY CATH 14FRSI W/METER (CATHETERS) ×3 IMPLANT

## 2013-05-14 NOTE — Preoperative (Signed)
Beta Blockers   Reason not to administer Beta Blockers:Not Applicable 

## 2013-05-14 NOTE — Transfer of Care (Signed)
Immediate Anesthesia Transfer of Care Note  Patient: [facility]  Procedure(s) Performed: Procedure(s): RIGHT TOTAL HIP ARTHROPLASTY ANTERIOR APPROACH (Right)  Patient Location: PACU  Anesthesia Type:Spinal  Level of Consciousness: awake, alert  and oriented  Airway & Oxygen Therapy: Patient Spontanous Breathing and Patient connected to face mask oxygen  Post-op Assessment: Report given to PACU RN and Post -op Vital signs reviewed and stable  Post vital signs: Reviewed and stable  Complications: No apparent anesthesia complications

## 2013-05-14 NOTE — Interval H&P Note (Signed)
History and Physical Interval Note:  05/14/2013 12:18 PM  [name]  has presented today for surgery, with the diagnosis of OA RIGHT HIP   The various methods of treatment have been discussed with the patient and family. After consideration of risks, benefits and other options for treatment, the patient has consented to  Procedure(s): RIGHT TOTAL HIP ARTHROPLASTY ANTERIOR APPROACH (Right) as a surgical intervention .  The patient's history has been reviewed, patient examined, no change in status, stable for surgery.  I have reviewed the patient's chart and labs.  Questions were answered to the patient's satisfaction.     Andrea Vasquez

## 2013-05-14 NOTE — Anesthesia Postprocedure Evaluation (Signed)
Anesthesia Post Note  Patient: Andrea Vasquez  Procedure(s) Performed: Procedure(s) (LRB): RIGHT TOTAL HIP ARTHROPLASTY ANTERIOR APPROACH (Right)  Anesthesia type: Spinal  Patient location: PACU  Post pain: Pain level controlled  Post assessment: Post-op Vital signs reviewed  Last Vitals: BP 141/88  Pulse 67  Temp(Src) 36.3 C (Oral)  Resp 14  Ht 5\' 10"  (1.778 m)  Wt 135 lb (61.236 kg)  BMI 19.37 kg/m2  SpO2 98%  Post vital signs: Reviewed  Level of consciousness: sedated  Complications: No apparent anesthesia complications

## 2013-05-14 NOTE — Plan of Care (Signed)
Problem: Consults Goal: Diagnosis- Total Joint Replacement Outcome: Completed/Met Date Met:  05/14/13 Primary Total Hip RIGHT, Anterior

## 2013-05-14 NOTE — Anesthesia Preprocedure Evaluation (Addendum)
Anesthesia Evaluation    Airway Mallampati: II TM Distance: >3 FB Neck ROM: Full    Dental  (+) Dental Advisory Given   Pulmonary former smoker,  breath sounds clear to auscultation        Cardiovascular hypertension, + dysrhythmias Rhythm:Regular Rate:Normal     Neuro/Psych    GI/Hepatic   Endo/Other    Renal/GU      Musculoskeletal   Abdominal   Peds  Hematology   Anesthesia Other Findings   Reproductive/Obstetrics                         Anesthesia Physical Anesthesia Plan  ASA: II  Anesthesia Plan:    Post-op Pain Management:    Induction:   Airway Management Planned:   Additional Equipment:   Intra-op Plan:   Post-operative Plan:   Informed Consent: I have reviewed the patients History and Physical, chart, labs and discussed the procedure including the risks, benefits and alternatives for the proposed anesthesia with the patient or authorized representative who has indicated his/her understanding and acceptance.   Dental advisory given  Plan Discussed with: CRNA  Anesthesia Plan Comments:         Anesthesia Quick Evaluation

## 2013-05-14 NOTE — Op Note (Signed)
OPERATIVE REPORT  PREOPERATIVE DIAGNOSIS: Osteoarthritis of the Right hip.   POSTOPERATIVE DIAGNOSIS: Osteoarthritis of the Right  hip.   PROCEDURE: Right total hip arthroplasty, anterior approach.   SURGEON: Gaynelle Arabian, MD   ASSISTANT: Arlee Muslim, PA-C  ANESTHESIA:  Spinal  ESTIMATED BLOOD LOSS:- 300 ml  DRAINS: Hemovac x1.   COMPLICATIONS: None   CONDITION: PACU - hemodynamically stable.   BRIEF CLINICAL NOTE: Andrea Vasquez is a 78 y.o. female who has advanced end-  stage arthritis of his Right  hip with progressively worsening pain and  dysfunction.The patient has failed nonoperative management and presents for  total hip arthroplasty.   PROCEDURE IN DETAIL: After successful administration of spinal  anesthetic, the traction boots for the [facility] bed were placed on both  feet and the patient was placed onto the [facility] bed, boots placed into the leg  holders. The Right hip was then isolated from the perineum with plastic  drapes and prepped and draped in the usual sterile fashion. ASIS and  greater trochanter were marked and a oblique incision was made, starting  at about 1 cm lateral and 2 cm distal to the ASIS and coursing towards  the anterior cortex of the femur. The skin was cut with a 10 blade  through subcutaneous tissue to the level of the fascia overlying the  tensor fascia lata muscle. The fascia was then incised in line with the  incision at the junction of the anterior third and posterior 2/3rd. The  muscle was teased off the fascia and then the interval between the TFL  and the rectus was developed. The Hohmann retractor was then placed at  the top of the femoral neck over the capsule. The vessels overlying the  capsule were cauterized and the fat on top of the capsule was removed.  A Hohmann retractor was then placed anterior underneath the rectus  femoris to give exposure to the entire anterior capsule. A T-shaped  capsulotomy was  performed. The edges were tagged and the femoral head  was identified.       Osteophytes are removed off the superior acetabulum.  The femoral neck was then cut in situ with an oscillating saw. Traction  was then applied to the left lower extremity utilizing the [facility]  traction. The femoral head was then removed. Retractors were placed  around the acetabulum and then circumferential removal of the labrum was  performed. Osteophytes were also removed. Reaming starts at 45 mm to  medialize and  Increased in 2 mm increments to 49 mm. We reamed in  approximately 40 degrees of abduction, 20 degrees anteversion. A 50 mm  pinnacle acetabular shell was then impacted in anatomic position under  fluoroscopic guidance with excellent purchase. We did not need to place  any additional dome screws. A 32 mm neutral + 4 marathon liner was then  placed into the acetabular shell.       The femoral lift was then placed along the lateral aspect of the femur  just distal to the vastus ridge. The leg was  externally rotated and capsule  was stripped off the inferior aspect of the femoral neck down to the  level of the lesser trochanter, this was done with electrocautery. The femur was lifted after this was performed. The  leg was then placed and extended in adducted position to essentially delivering the femur. We also removed the capsule superiorly and the  piriformis from the piriformis  fossa to gain excellent exposure of the  proximal femur. Rongeur was used to remove some cancellous bone to get  into the lateral portion of the proximal femur for placement of the  initial starter reamer. The starter broaches was placed  the starter broach  and was shown to go down the center of the canal. Broaching  with the  Corail system was then performed starting at size 8, coursing  Up to size 13. A size 13 had excellent torsional and rotational  and axial stability. The trial high offset neck was then placed  with a 32 +  1 trial head. The hip was then reduced. We confirmed that  the stem was in the canal both on AP and lateral x-rays. It also has excellent sizing. The hip was reduced with outstanding stability through full extension, full external rotation,  and then flexion in adduction internal rotation. AP pelvis was taken  and the leg lengths were measured and found to be exactly equal. Hip  was then dislocated again and the femoral head and neck removed. The  femoral broach was removed. Size 13 Corail stem with a high offset  neck was then impacted into the femur following native anteversion. Has  excellent purchase in the canal. Excellent torsional and rotational and  axial stability. It is confirmed to be in the canal on AP and lateral  fluoroscopic views. The 32 + 1 metal head was placed and the hip  reduced with outstanding stability. Again AP pelvis was taken and it  confirmed that the leg lengths were equal. The wound was then copiously  irrigated with saline solution and the capsule reattached and repaired  with Ethibond suture.  20 mL of Exparel mixed with 50 mL of saline then additional 20 ml of .25% Bupivicaine injected into the capsule and into the edge of the tensor fascia lata as well as subcutaneous tissue. The fascia overlying the tensor fascia lata was  then closed with a running #1 V-Loc. Subcu was closed with interrupted  2-0 Vicryl and subcuticular running 4-0 Monocryl. Incision was cleaned  and dried. Steri-Strips and a bulky sterile dressing applied. Hemovac  drain was hooked to suction and then he was awakened and transported to  recovery in stable condition.        Please note that a surgical assistant was a medical necessity for this procedure to perform it in a safe and expeditious manner. Assistant was necessary to provide appropriate retraction of vital neurovascular structures and to prevent femoral fracture and allow for anatomic placement of the prosthesis.  Gaynelle Arabian, M.D.

## 2013-05-14 NOTE — Anesthesia Procedure Notes (Signed)
Spinal  Patient location during procedure: OR Start time: 05/14/2013 1:01 PM End time: 05/14/2013 1:04 PM Staffing Anesthesiologist: Nolon Nations R Performed by: anesthesiologist  Preanesthetic Checklist Completed: patient identified, site marked, surgical consent, pre-op evaluation, timeout performed, IV checked, risks and benefits discussed and monitors and equipment checked Spinal Block Patient position: sitting Prep: ChloraPrep Patient monitoring: heart rate, continuous pulse ox and blood pressure Approach: right paramedian Location: L2-3 Injection technique: single-shot Needle Needle type: Sprotte  Needle gauge: 24 G Needle length: 9 cm Assessment Sensory level: T8 Additional Notes Expiration date of kit checked and confirmed. Patient tolerated procedure well, without complications.

## 2013-05-15 DIAGNOSIS — D62 Acute posthemorrhagic anemia: Secondary | ICD-10-CM | POA: Diagnosis not present

## 2013-05-15 DIAGNOSIS — E876 Hypokalemia: Secondary | ICD-10-CM | POA: Diagnosis not present

## 2013-05-15 LAB — CBC
HCT: 35.2 % — ABNORMAL LOW (ref 36.0–46.0)
Hemoglobin: 11.3 g/dL — ABNORMAL LOW (ref 12.0–15.0)
MCH: 31 pg (ref 26.0–34.0)
MCHC: 32.1 g/dL (ref 30.0–36.0)
MCV: 96.7 fL (ref 78.0–100.0)
PLATELETS: 229 10*3/uL (ref 150–400)
RBC: 3.64 MIL/uL — AB (ref 3.87–5.11)
RDW: 12.8 % (ref 11.5–15.5)
WBC: 12 10*3/uL — AB (ref 4.0–10.5)

## 2013-05-15 LAB — BASIC METABOLIC PANEL
BUN: 10 mg/dL (ref 6–23)
CALCIUM: 8.3 mg/dL — AB (ref 8.4–10.5)
CHLORIDE: 106 meq/L (ref 96–112)
CO2: 25 mEq/L (ref 19–32)
Creatinine, Ser: 0.58 mg/dL (ref 0.50–1.10)
GFR calc Af Amer: >90 mL/min (ref >90)
GFR calc non Af Amer: 88 mL/min — ABNORMAL LOW (ref >90)
Glucose, Bld: 143 mg/dL — ABNORMAL HIGH (ref 70–99)
Potassium: 3.6 mEq/L — ABNORMAL LOW (ref 3.7–5.3)
SODIUM: 141 meq/L (ref 137–147)

## 2013-05-15 MED ORDER — POTASSIUM CHLORIDE CRYS ER 20 MEQ PO TBCR
40.0000 meq | EXTENDED_RELEASE_TABLET | Freq: Two times a day (BID) | ORAL | Status: AC
  Administered 2013-05-15 (×2): 40 meq via ORAL
  Filled 2013-05-15 (×2): qty 2

## 2013-05-15 NOTE — Evaluation (Signed)
Occupational Therapy Evaluation Patient Details Name: Andrea Vasquez MRN: [id number] DOB: 09/05/1935 Today's Date: 05/15/2013 Time: 0814-4818 OT Time Calculation (min): 14 min  OT Assessment / Plan / Recommendation History of present illness pt was admitted for R DA THA   Clinical Impression   Pt was admitted for the above.  She will benefit from skilled OT in acute to complete education and reach a supervision level with adls/bathroom transfers.  Do not anticipate she will need follow up OT    OT Assessment  Patient needs continued OT Services    Follow Up Recommendations  Supervision/Assistance - 24 hour (initial)    Barriers to Discharge      Equipment Recommendations  None recommended by OT    Recommendations for Other Services    Frequency  Min 2X/week    Precautions / Restrictions Precautions Precautions: Fall Restrictions Other Position/Activity Restrictions: WBAT   Pertinent Vitals/Pain Sore, R hip    ADL  Grooming: Wash/dry hands;Min guard Where Assessed - Grooming: Supported standing Lower Body Bathing: Minimal assistance (with ae) Where Assessed - Lower Body Bathing: Supported sit to stand Lower Body Dressing: Minimal assistance (with ae) Where Assessed - Lower Body Dressing: Supported sit to Lobbyist: Minimal assistance Toilet Transfer Method: Sit to Loss adjuster, chartered: Raised toilet seat with arms (or 3-in-1 over toilet) Toileting - Clothing Manipulation and Hygiene: Set up Where Assessed - Best boy and Hygiene: Lean right and/or left Equipment Used: Rolling walker Transfers/Ambulation Related to ADLs: ambulated to bathroom with min A ADL Comments: pt has AE kit and has been using this.  She has a tub bench which she has not used.  Pt can perform UB adls with set up    OT Diagnosis: Generalized weakness  OT Problem List: Decreased strength;Decreased activity tolerance;Decreased knowledge of use of DME  or AE OT Treatment Interventions: Self-care/ADL training;DME and/or AE instruction;Patient/family education   OT Goals(Current goals can be found in the care plan section) Acute Rehab OT Goals Patient Stated Goal: Get back to doing the things I want to do OT Goal Formulation: With patient Time For Goal Achievement: 05/22/13 Potential to Achieve Goals: Good ADL Goals Pt Will Transfer to Toilet: with supervision;bedside commode;ambulating Pt Will Perform Tub/Shower Transfer: with min assist;Tub transfer;tub bench Additional ADL Goal #1: pt will perform LB adls and grooming with supervision, sit to stand (and AE as needed)  Visit Information  Last OT Received On: 05/15/13 Assistance Needed: +1 PT/OT/SLP Co-Evaluation/Treatment: Yes Reason for Co-Treatment: For patient/therapist safety OT goals addressed during session: ADL's and self-care History of Present Illness: pt was admitted for R DA THA       [place] expects to be discharged to:: Private residence Living Arrangements: Alone Available Help at Discharge: Family;Friend(s);Available 24 hours/day Home Equipment: Bedside commode;Tub bench;Adaptive equipment Prior Function Level of Independence: Independent;Independent with assistive device(s) Communication Communication: No difficulties         Vision/Perception     Cognition  Cognition Arousal/Alertness: Awake/alert Behavior During Therapy: WFL for tasks assessed/performed Overall Cognitive Status: Within Functional Limits for tasks assessed    Extremity/Trunk Assessment Upper Extremity Assessment Upper Extremity Assessment: Overall WFL for tasks assessed     Mobility Transfers Transfers: Sit to/from Stand Sit to Stand: Min guard General transfer comment: cues for UE/LE position     Exercise     Balance     End of Session OT - End of Session Activity Tolerance:  Patient tolerated treatment well Patient left:   (with PT)  GO     Melvyn Hommes 05/15/2013, 3:45 PM Lesle Chris, OTR/L [phone] 05/15/2013

## 2013-05-15 NOTE — Progress Notes (Signed)
Physical Therapy Treatment Patient Details Name: Andrea Vasquez MRN: [id number] DOB: April 11, 1935 Today's Date: 05/15/2013 Time: 6222-9798 PT Time Calculation (min): 27 min  PT Assessment / Plan / Recommendation  History of Present Illness pt was admitted for R DA THA   PT Comments     Follow Up Recommendations  Home health PT     Does the patient have the potential to tolerate intense rehabilitation     Barriers to Discharge        Equipment Recommendations  None recommended by PT    Recommendations for Other Services OT consult  Frequency 7X/week   Progress towards PT Goals Progress towards PT goals: Progressing toward goals  Plan Current plan remains appropriate    Precautions / Restrictions Precautions Precautions: Fall Restrictions Weight Bearing Restrictions: No Other Position/Activity Restrictions: WBAT   Pertinent Vitals/Pain 3/10; premed, ice pack provided    Mobility  Bed Mobility Overal bed mobility: Needs Assistance Bed Mobility: Sit to Supine Sit to supine: Min assist General bed mobility comments: cues for sequence and use of L LE to self assist Transfers Overall transfer level: Needs assistance Equipment used: Rolling walker (2 wheeled) Transfers: Sit to/from Stand Sit to Stand: Min assist General transfer comment: cues for UE/LE position Ambulation/Gait Ambulation/Gait assistance: Min assist Ambulation Distance (Feet): 123 Feet (and 25) Assistive device: Rolling walker (2 wheeled) Gait Pattern/deviations: Step-to pattern;Decreased step length - right;Decreased step length - left;Shuffle;Trunk flexed;Antalgic Gait velocity: decr General Gait Details: Cues for sequence, stride length, position from RW and posture.  LTd by nausea    Exercises     PT Diagnosis:    PT Problem List:   PT Treatment Interventions:     PT Goals (current goals can now be found in the care plan section) Acute Rehab PT Goals Patient Stated Goal: Get back to doing  the things I want to do PT Goal Formulation: With patient Time For Goal Achievement: 05/22/13 Potential to Achieve Goals: Good  Visit Information  Last PT Received On: 05/15/13 Assistance Needed: +1 PT/OT/SLP Co-Evaluation/Treatment: Yes Reason for Co-Treatment: For patient/therapist safety PT goals addressed during session: Mobility/safety with mobility OT goals addressed during session: ADL's and self-care History of Present Illness: pt was admitted for R DA THA    Subjective Data  Subjective: I am so glad I am doing better, I was worried Patient Stated Goal: Get back to doing the things I want to do   Cognition  Cognition Arousal/Alertness: Awake/alert Behavior During Therapy: WFL for tasks assessed/performed Overall Cognitive Status: Within Functional Limits for tasks assessed    Balance     End of Session PT - End of Session Equipment Utilized During Treatment: Gait belt Activity Tolerance: Patient tolerated treatment well Patient left: in bed;with call bell/phone within reach;with nursing/sitter in room Nurse Communication: Mobility status   GP     Wells Gerdeman 05/15/2013, 4:49 PM

## 2013-05-15 NOTE — Progress Notes (Signed)
Utilization review completed.  

## 2013-05-15 NOTE — Discharge Instructions (Addendum)
°Dr. Frank Aluisio °Total Joint Specialist °[facility] °[address] °[place], [place] [address] °[phone] ° ° ° °ANTERIOR APPROACH TOTAL HIP REPLACEMENT POSTOPERATIVE DIRECTIONS ° ° °Hip Rehabilitation, Guidelines Following Surgery  °The results of a hip operation are greatly improved after range of motion and muscle strengthening exercises. Follow all safety measures which are given to protect your hip. If any of these exercises cause increased pain or swelling in your joint, decrease the amount until you are comfortable again. Then slowly increase the exercises. Call your caregiver if you have problems or questions.  °HOME CARE INSTRUCTIONS  °Most of the following instructions are designed to prevent the dislocation of your new hip.  °Remove items at home which could result in a fall. This includes throw rugs or furniture in walking pathways.  °Continue medications as instructed at time of discharge. °· You may have some home medications which will be placed on hold until you complete the course of blood thinner medication. °· You may start showering once you are discharged home but do not submerge the incision under water. Just pat the incision dry and apply a dry gauze dressing on daily. °Do not put on socks or shoes without following the instructions of your caregivers.  °Sit on high chairs which makes it easier to stand.  °Sit on chairs with arms. Use the chair arms to help push yourself up when arising.  °Keep your leg on the side of the operation out in front of you when standing up.  °Arrange for the use of a toilet seat elevator so you are not sitting low.   °· Walk with walker as instructed.  °You may resume a sexual relationship in one month or when given the OK by your caregiver.  °Use walker as long as suggested by your caregivers.  °You may put full weight on your legs and walk as much as is comfortable. °Avoid periods of inactivity such as sitting longer than an hour  when not asleep. This helps prevent blood clots.  °You may return to work once you are cleared by your surgeon.  °Do not drive a car for 6 weeks or until released by your surgeon.  °Do not drive while taking narcotics.  °Wear elastic stockings for three weeks following surgery during the day but you may remove then at night.  °Make sure you keep all of your appointments after your operation with all of your doctors and caregivers. You should call the office at the above phone number and make an appointment for approximately two weeks after the date of your surgery. °Change the dressing daily and reapply a dry dressing each time. °Please pick up a stool softener and laxative for home use as long as you are requiring pain medications. °· Continue to use ice on the hip for pain and swelling from surgery. You may notice swelling that will progress down to the foot and ankle.  This is normal after  surgery.  Elevate the leg when you are not up walking on it.   °It is important for you to complete the blood thinner medication as prescribed by your doctor. °· Continue to use the breathing machine which will help keep your temperature down.  It is common for your temperature to cycle up and down following surgery, especially at night when you are not up moving around and exerting yourself.  The breathing machine keeps your lungs expanded and your temperature down. ° °RANGE OF MOTION AND STRENGTHENING EXERCISES  °  These exercises are designed to help you keep full movement of your hip joint. Follow your caregiver's or physical therapist's instructions. Perform all exercises about fifteen times, three times per day or as directed. Exercise both hips, even if you have had only one joint replacement. These exercises can be done on a training (exercise) mat, on the floor, on a table or on a bed. Use whatever works the best and is most comfortable for you. Use music or television while you are exercising so that the exercises are  a pleasant break in your day. This will make your life better with the exercises acting as a break in routine you can look forward to.  Lying on your back, slowly slide your foot toward your buttocks, raising your knee up off the floor. Then slowly slide your foot back down until your leg is straight again.  Lying on your back spread your legs as far apart as you can without causing discomfort.  Lying on your side, raise your upper leg and foot straight up from the floor as far as is comfortable. Slowly lower the leg and repeat.  Lying on your back, tighten up the muscle in the front of your thigh (quadriceps muscles). You can do this by keeping your leg straight and trying to raise your heel off the floor. This helps strengthen the largest muscle supporting your knee.  Lying on your back, tighten up the muscles of your buttocks both with the legs straight and with the knee bent at a comfortable angle while keeping your heel on the floor.   SKILLED REHAB INSTRUCTIONS: If the patient is transferred to a skilled rehab facility following release from the hospital, a list of the current medications will be sent to the facility for the patient to continue.  When discharged from the skilled rehab facility, please have the facility set up the patient's [place] prior to being released. Also, the skilled facility will be responsible for providing the patient with their medications at time of release from the facility to include their pain medication, the muscle relaxants, and their blood thinner medication. If the patient is still at the rehab facility at time of the two week follow up appointment, the skilled rehab facility will also need to assist the patient in arranging follow up appointment in our office and any transportation needs.  MAKE SURE YOU:  Understand these instructions.  Will watch your condition.  Will get help right away if you are not doing well or get worse.  Pick up  stool softner and laxative for home. Do not submerge incision under water. May shower. Continue to use ice for pain and swelling from surgery. Total Hip Protocol.  Take Xarelto for two and a half more weeks, then discontinue Xarelto. Once the patient has completed the blood thinner regimen, then take a Baby 81 mg Aspirin daily for four more weeks.   Information on my medicine - XARELTO (Rivaroxaban)  This medication education was reviewed with me or my healthcare representative as part of my discharge preparation.  The pharmacist that spoke with me during my hospital stay was:  Absher, Julieta Bellini, RPH  Why was Xarelto prescribed for you? Xarelto was prescribed for you to reduce the risk of blood clots forming after orthopedic surgery. The medical term for these abnormal blood clots is venous thromboembolism (VTE).  What do you need to know about xarelto ? Take your Xarelto ONCE DAILY at the same time every day. You  may take it either with or without food.  If you have difficulty swallowing the tablet whole, you may crush it and mix in applesauce just prior to taking your dose.  Take Xarelto exactly as prescribed by your doctor and DO NOT stop taking Xarelto without talking to the doctor who prescribed the medication.  Stopping without other VTE prevention medication to take the place of Xarelto may increase your risk of developing a clot.  After discharge, you should have regular check-up appointments with your healthcare provider that is prescribing your Xarelto.    What do you do if you miss a dose? If you miss a dose, take it as soon as you remember on the same day then continue your regularly scheduled once daily regimen the next day. Do not take two doses of Xarelto on the same day.   Important Safety Information A possible side effect of Xarelto is bleeding. You should call your healthcare provider right away if you experience any of the following:   Bleeding from an  injury or your nose that does not stop.   Unusual colored urine (red or dark brown) or unusual colored stools (red or black).   Unusual bruising for unknown reasons.   A serious fall or if you hit your head (even if there is no bleeding).  Some medicines may interact with Xarelto and might increase your risk of bleeding while on Xarelto. To help avoid this, consult your healthcare provider or pharmacist prior to using any new prescription or non-prescription medications, including herbals, vitamins, non-steroidal anti-inflammatory drugs (NSAIDs) and supplements.  This website has more information on Xarelto: [email or web address]

## 2013-05-15 NOTE — Evaluation (Signed)
Physical Therapy Evaluation Patient Details Name: Andrea Vasquez MRN: [id number] DOB: November 04, 1935 Today's Date: 05/15/2013 Time: 1022-1055 PT Time Calculation (min): 33 min  PT Assessment / Plan / Recommendation History of Present Illness     Clinical Impression  Pt s/p R THR presents with decreased R LE strength/ROM and post op pain and nausea limiting functional mobility.  Pt should progress to d/c home with follow up HHPT and assist of family and friends.    PT Assessment  Patient needs continued PT services    Follow Up Recommendations  Home health PT    Does the patient have the potential to tolerate intense rehabilitation      Barriers to Discharge        Equipment Recommendations  None recommended by PT    Recommendations for Other Services OT consult   Frequency 7X/week    Precautions / Restrictions Precautions Precautions: Fall Restrictions Weight Bearing Restrictions: No Other Position/Activity Restrictions: WBAT   Pertinent Vitals/Pain 4/10; premed, ice pack provided      Mobility  Bed Mobility Overal bed mobility: Needs Assistance Bed Mobility: Supine to Sit Supine to sit: Mod assist General bed mobility comments: cues for sequence and use of L LE to self assist Transfers Overall transfer level: Needs assistance Equipment used: Rolling walker (2 wheeled) Transfers: Sit to/from Stand Sit to Stand: Mod assist General transfer comment: cues for LE management and use of UEs to self assist Ambulation/Gait Ambulation/Gait assistance: +2 physical assistance;+2 safety/equipment;Mod assist Ambulation Distance (Feet): 10 Feet Assistive device: Rolling walker (2 wheeled) Gait Pattern/deviations: Step-to pattern;Decreased step length - right;Decreased step length - left;Shuffle;Antalgic;Trunk flexed Gait velocity: decr General Gait Details: Cues for sequence, stride length, position from RW and posture.  LTd by nausea    Exercises Total Joint  Exercises Ankle Circles/Pumps: AROM;15 reps;Both;Supine Quad Sets: AROM;Both;10 reps;Supine Heel Slides: AAROM;15 reps;Supine;Right Hip ABduction/ADduction: AAROM;Right;15 reps;Supine   PT Diagnosis: Difficulty walking  PT Problem List: Decreased strength;Decreased range of motion;Decreased activity tolerance;Decreased mobility;Decreased knowledge of use of DME;Pain PT Treatment Interventions: DME instruction;Gait training;Stair training;Functional mobility training;Therapeutic activities;Therapeutic exercise;Patient/family education     PT Goals(Current goals can be found in the care plan section) Acute Rehab PT Goals Patient Stated Goal: Get back to doing the things I want to do PT Goal Formulation: With patient Time For Goal Achievement: 05/22/13 Potential to Achieve Goals: Good  Visit Information  Last PT Received On: 05/15/13 Assistance Needed: +2 (nausea with amb)       [place] expects to be discharged to:: Private residence Living Arrangements: Alone Available Help at Discharge: Family;Friend(s);Available 24 hours/day Type of Home: House Home Access: Stairs to enter [employer] of Steps: 1 Entrance Stairs-Rails: None Home Layout: One level Home Equipment: Walker - 4 wheels Prior Function Level of Independence: Independent;Independent with assistive device(s) Communication Communication: No difficulties    Cognition  Cognition Arousal/Alertness: Awake/alert Behavior During Therapy: WFL for tasks assessed/performed Overall Cognitive Status: Within Functional Limits for tasks assessed    Extremity/Trunk Assessment Upper Extremity Assessment Upper Extremity Assessment: Overall WFL for tasks assessed Lower Extremity Assessment Lower Extremity Assessment: RLE deficits/detail RLE Deficits / Details: Hip strength 2/5 with AAROM at hip to 75 flex and 10 abd   Balance    End of Session PT - End of Session Equipment  Utilized During Treatment: Gait belt Activity Tolerance: Patient tolerated treatment well;Other (comment) (nausea) Patient left: in chair;with call bell/phone within reach;with nursing/sitter in room Nurse Communication: Mobility status  GP     Andrea Vasquez 05/15/2013, 12:48 PM

## 2013-05-15 NOTE — Progress Notes (Signed)
   Subjective: 1 Day Post-Op Procedure(s) (LRB): RIGHT TOTAL HIP ARTHROPLASTY ANTERIOR APPROACH (Right) Patient reports pain as mild.   Patient seen in rounds with Dr. Wynelle Link. Patient is well, but has had some minor complaints of pain in the hip, requiring pain medications We will start therapy today.  Plan is to go Home after hospital stay.  Objective: Vital signs in last 24 hours: Temp:  [94 F (34.4 C)-98 F (36.7 C)] 97.9 F (36.6 C) (02/05 0639) Pulse Rate:  [61-95] 61 (02/05 0639) Resp:  [11-24] 16 (02/05 0639) BP: (106-156)/(60-88) 126/69 mmHg (02/05 0639) SpO2:  [95 %-100 %] 99 % (02/05 0639) Weight:  [61.236 kg (135 lb)] 61.236 kg (135 lb) (02/04 1600)  Intake/Output from previous day:  Intake/Output Summary (Last 24 hours) at 05/15/13 0802 Last data filed at 05/15/13 0700  Gross per 24 hour  Intake 4278.75 ml  Output   3115 ml  Net 1163.75 ml    Intake/Output this shift: UOP 2000 since MN +1163  Labs:  Recent Labs  05/15/13 0411  HGB 11.3*    Recent Labs  05/15/13 0411  WBC 12.0*  RBC 3.64*  HCT 35.2*  PLT 229    Recent Labs  05/15/13 0411  NA 141  K 3.6*  CL 106  CO2 25  BUN 10  CREATININE 0.58  GLUCOSE 143*  CALCIUM 8.3*   No results found for this basename: LABPT, INR,  in the last 72 hours  EXAM General - Patient is Alert, Appropriate and Oriented Extremity - Neurovascular intact Sensation intact distally Dorsiflexion/Plantar flexion intact Dressing - dressing C/D/I Motor Function - intact, moving foot and toes well on exam.  Hemovac pulled without difficulty.  Past Medical History  Diagnosis Date  . Hypercholesteremia     controlled by medicine  . Hypertension   . Goiter     small on right side, monitored  . Depression     mild  . Arthritis   . Cancer 1992    right breast  . Dysrhythmia     "heart fluttering-been checked out, nothing wrong"    Assessment/Plan: 1 Day Post-Op Procedure(s) (LRB): RIGHT TOTAL HIP  ARTHROPLASTY ANTERIOR APPROACH (Right) Principal Problem:   OA (osteoarthritis) of hip Active Problems:   Hypokalemia   Postoperative anemia due to acute blood loss  Estimated body mass index is 19.37 kg/(m^2) as calculated from the following:   Height as of this encounter: 5\' 10"  (1.778 m).   Weight as of this encounter: 61.236 kg (135 lb). Advance diet Up with therapy Plan for discharge tomorrow Discharge home with home health  DVT Prophylaxis - Xarelto Weight Bearing As Tolerated right Leg Hemovac Pulled Begin Therapy   Andrea Vasquez 05/15/2013, 8:02 AM

## 2013-05-16 LAB — CBC
HCT: 35.8 % — ABNORMAL LOW (ref 36.0–46.0)
Hemoglobin: 11.8 g/dL — ABNORMAL LOW (ref 12.0–15.0)
MCH: 31.8 pg (ref 26.0–34.0)
MCHC: 33 g/dL (ref 30.0–36.0)
MCV: 96.5 fL (ref 78.0–100.0)
PLATELETS: 259 10*3/uL (ref 150–400)
RBC: 3.71 MIL/uL — AB (ref 3.87–5.11)
RDW: 13.1 % (ref 11.5–15.5)
WBC: 14.5 10*3/uL — ABNORMAL HIGH (ref 4.0–10.5)

## 2013-05-16 LAB — BASIC METABOLIC PANEL
BUN: 7 mg/dL (ref 6–23)
CALCIUM: 8.6 mg/dL (ref 8.4–10.5)
CO2: 27 meq/L (ref 19–32)
CREATININE: 0.57 mg/dL (ref 0.50–1.10)
Chloride: 109 mEq/L (ref 96–112)
GFR, EST NON AFRICAN AMERICAN: 88 mL/min — AB (ref >90)
Glucose, Bld: 105 mg/dL — ABNORMAL HIGH (ref 70–99)
Potassium: 4.1 mEq/L (ref 3.7–5.3)
SODIUM: 144 meq/L (ref 137–147)

## 2013-05-16 MED ORDER — RIVAROXABAN 10 MG PO TABS: 10.0000 mg | ORAL_TABLET | Freq: Every day | ORAL | Status: DC

## 2013-05-16 MED ORDER — TRAMADOL HCL 50 MG PO TABS: 50.0000 mg | ORAL_TABLET | Freq: Four times a day (QID) | ORAL | Status: DC | PRN

## 2013-05-16 MED ORDER — OXYCODONE HCL 5 MG PO TABS: 5.0000 mg | ORAL_TABLET | ORAL | Status: DC | PRN

## 2013-05-16 MED ORDER — METHOCARBAMOL 500 MG PO TABS: 500.0000 mg | ORAL_TABLET | Freq: Four times a day (QID) | ORAL | Status: DC | PRN

## 2013-05-16 NOTE — Progress Notes (Signed)
OT Cancellation Note and discharge:  Patient Details Name: Andrea Vasquez MRN: [id number] DOB: [birth year]   Cancelled Treatment:    Reason Eval/Treat Not Completed: Other (comment). Checked with pt; she feels comfortable with tub bench. No further OT needs.    Tenille Morrill 05/16/2013, 8:05 AM Lesle Chris, OTR/L [phone] 05/16/2013

## 2013-05-16 NOTE — Progress Notes (Addendum)
   Subjective: 2 Days Post-Op Procedure(s) (LRB): RIGHT TOTAL HIP ARTHROPLASTY ANTERIOR APPROACH (Right) Patient reports pain as mild.   Patient seen in rounds by Dr. Wynelle Link. Patient is well, and has had no acute complaints or problems Patient is ready to go home after therapy. She has been up in the hallway already this morning.  Objective: Vital signs in last 24 hours: Temp:  [97.6 F (36.4 C)-98.9 F (37.2 C)] 98.9 F (37.2 C) (02/06 0623) Pulse Rate:  [71-81] 76 (02/06 0623) Resp:  [15-18] 16 (02/06 0623) BP: (108-152)/(67-91) 152/91 mmHg (02/06 0623) SpO2:  [94 %-98 %] 94 % (02/06 0623)  Intake/Output from previous day:  Intake/Output Summary (Last 24 hours) at 05/16/13 4401 Last data filed at 05/16/13 0914  Gross per 24 hour  Intake    960 ml  Output   1650 ml  Net   -690 ml    Intake/Output this shift: Total I/O In: -  Out: 1000 [Urine:1000]  Labs:  Recent Labs  05/15/13 0411 05/16/13 0350  HGB 11.3* 11.8*    Recent Labs  05/15/13 0411 05/16/13 0350  WBC 12.0* 14.5*  RBC 3.64* 3.71*  HCT 35.2* 35.8*  PLT 229 259    Recent Labs  05/15/13 0411 05/16/13 0350  NA 141 144  K 3.6* 4.1  CL 106 109  CO2 25 27  BUN 10 7  CREATININE 0.58 0.57  GLUCOSE 143* 105*  CALCIUM 8.3* 8.6   No results found for this basename: LABPT, INR,  in the last 72 hours  EXAM: General - Patient is Alert, Appropriate and Oriented Extremity - Neurovascular intact Sensation intact distally Incision - clean, dry, no drainage Motor Function - intact, moving foot and toes well on exam.   Assessment/Plan: 2 Days Post-Op Procedure(s) (LRB): RIGHT TOTAL HIP ARTHROPLASTY ANTERIOR APPROACH (Right) Procedure(s) (LRB): RIGHT TOTAL HIP ARTHROPLASTY ANTERIOR APPROACH (Right) Past Medical History  Diagnosis Date  . Hypercholesteremia     controlled by medicine  . Hypertension   . Goiter     small on right side, monitored  . Depression     mild  . Arthritis   .  Cancer 1992    right breast  . Dysrhythmia     "heart fluttering-been checked out, nothing wrong"   Principal Problem:   OA (osteoarthritis) of hip Active Problems:   Hypokalemia   Postoperative anemia due to acute blood loss  Estimated body mass index is 19.37 kg/(m^2) as calculated from the following:   Height as of this encounter: 5\' 10"  (1.778 m).   Weight as of this encounter: 61.236 kg (135 lb). Up with therapy Discharge home with home health Diet - Cardiac diet Follow up - in 2 weeks Activity - WBAT Disposition - Home Condition Upon Discharge - Good D/C Meds - See DC Summary DVT Prophylaxis - Xarelto  PERKINS, ALEXZANDREW 05/16/2013, 9:23 AM

## 2013-05-16 NOTE — Progress Notes (Signed)
Physical Therapy Treatment Patient Details Name: OTTO CARAWAY MRN: [id number] DOB: June 16, 1935 Today's Date: 05/16/2013 Time: 9381-8299 PT Time Calculation (min): 23 min  PT Assessment / Plan / Recommendation  History of Present Illness pt was admitted for R DA THA   PT Comments   Reviewed stairs and car transfers with pt and dtr.  Follow Up Recommendations  Home health PT     Does the patient have the potential to tolerate intense rehabilitation     Barriers to Discharge        Equipment Recommendations  Rolling walker with 5" wheels    Recommendations for Other Services OT consult  Frequency 7X/week   Progress towards PT Goals Progress towards PT goals: Progressing toward goals  Plan Current plan remains appropriate    Precautions / Restrictions Precautions Precautions: Fall Restrictions Weight Bearing Restrictions: No Other Position/Activity Restrictions: WBAT   Pertinent Vitals/Pain 3-4/10; meds requested, ice pack provided    Mobility  Bed Mobility Overal bed mobility: Needs Assistance Bed Mobility: Supine to Sit Supine to sit: Min guard General bed mobility comments: cues for sequence and use of L LE to self assist: pt utilizing sheet to self assist R LE OOB Transfers Overall transfer level: Needs assistance Equipment used: Rolling walker (2 wheeled) Transfers: Sit to/from Stand Sit to Stand: Supervision General transfer comment: cues for UE/LE position Ambulation/Gait Ambulation/Gait assistance: Min guard;Supervision Ambulation Distance (Feet): 65 Feet Assistive device: Rolling walker (2 wheeled) Gait Pattern/deviations: Step-to pattern;Decreased step length - right;Decreased step length - left;Shuffle;Trunk flexed Gait velocity: decr General Gait Details: Cues for sequence, stride length, position from RW and posture.  LTd by nausea Stairs: Yes Stairs assistance: Min assist Stair Management: No rails;Backwards;With walker;Step to pattern Number  of Stairs: 1 (twice, 2nd attempt with dtr assisting)    Exercises Total Joint Exercises Ankle Circles/Pumps: AROM;15 reps;Both;Supine Quad Sets: AROM;Both;10 reps;Supine Gluteal Sets: AROM;10 reps;Supine;Both Heel Slides: AAROM;Supine;Right;20 reps Hip ABduction/ADduction: AAROM;Right;Supine;20 reps   PT Diagnosis:    PT Problem List:   PT Treatment Interventions:     PT Goals (current goals can now be found in the care plan section) Acute Rehab PT Goals Patient Stated Goal: Get back to doing the things I want to do PT Goal Formulation: With patient Time For Goal Achievement: 05/22/13 Potential to Achieve Goals: Good  Visit Information  Last PT Received On: 05/16/13 Assistance Needed: +1 History of Present Illness: pt was admitted for R DA THA    Subjective Data  Patient Stated Goal: Get back to doing the things I want to do   Cognition  Cognition Arousal/Alertness: Awake/alert Behavior During Therapy: WFL for tasks assessed/performed Overall Cognitive Status: Within Functional Limits for tasks assessed    Balance     End of Session PT - End of Session Equipment Utilized During Treatment: Gait belt Activity Tolerance: Patient tolerated treatment well Patient left: in chair;with call bell/phone within reach Nurse Communication: Mobility status;Patient requests pain meds   GP     Omaira Mellen 05/16/2013, 12:26 PM

## 2013-05-16 NOTE — Progress Notes (Signed)
[facility]  [facility] is providing the following services: RW   If patient discharges after hours, please call [phone].   Linward Headland 05/16/2013, 10:34 AM

## 2013-05-16 NOTE — Progress Notes (Signed)
Physical Therapy Treatment Patient Details Name: Andrea Vasquez MRN: [id number] DOB: 04-20-1935 Today's Date: 05/16/2013 Time: 7062-3762 PT Time Calculation (min): 51 min  PT Assessment / Plan / Recommendation  History of Present Illness pt was admitted for R DA THA   PT Comments   Pt progressing well with limitations 2* nausea and R quad weakness  Follow Up Recommendations  Home health PT     Does the patient have the potential to tolerate intense rehabilitation     Barriers to Discharge        Equipment Recommendations  None recommended by PT;Rolling walker with 5" wheels    Recommendations for Other Services OT consult  Frequency 7X/week   Progress towards PT Goals Progress towards PT goals: Progressing toward goals  Plan Current plan remains appropriate    Precautions / Restrictions Precautions Precautions: Fall Restrictions Weight Bearing Restrictions: No Other Position/Activity Restrictions: WBAT   Pertinent Vitals/Pain 3/10; premed, ice pack provided.    Mobility  Bed Mobility Overal bed mobility: Needs Assistance Bed Mobility: Supine to Sit Supine to sit: Min guard General bed mobility comments: cues for sequence and use of L LE to self assist: pt utilizing sheet to self assist R LE OOB Transfers Overall transfer level: Needs assistance Equipment used: Rolling walker (2 wheeled) Transfers: Sit to/from Stand Sit to Stand: Min guard General transfer comment: cues for UE/LE position Ambulation/Gait Ambulation/Gait assistance: Min guard;Supervision Ambulation Distance (Feet): 160 Feet (and 25) Assistive device: Rolling walker (2 wheeled) Gait Pattern/deviations: Step-to pattern;Shuffle;Decreased step length - right;Decreased step length - left Gait velocity: decr General Gait Details: Cues for sequence, stride length, position from RW and posture.  LTd by nausea Stairs: Yes Stairs assistance: Min assist Stair Management: No rails;Step to  pattern;Backwards;With walker Number of Stairs: 1    Exercises Total Joint Exercises Ankle Circles/Pumps: AROM;15 reps;Both;Supine Quad Sets: AROM;Both;10 reps;Supine Gluteal Sets: AROM;10 reps;Supine;Both Heel Slides: AAROM;Supine;Right;20 reps Hip ABduction/ADduction: AAROM;Right;Supine;20 reps   PT Diagnosis:    PT Problem List:   PT Treatment Interventions:     PT Goals (current goals can now be found in the care plan section) Acute Rehab PT Goals Patient Stated Goal: Get back to doing the things I want to do PT Goal Formulation: With patient Time For Goal Achievement: 05/22/13 Potential to Achieve Goals: Good  Visit Information  Last PT Received On: 05/16/13 Assistance Needed: +1 History of Present Illness: pt was admitted for R DA THA    Subjective Data  Patient Stated Goal: Get back to doing the things I want to do   Cognition  Cognition Arousal/Alertness: Awake/alert Behavior During Therapy: WFL for tasks assessed/performed Overall Cognitive Status: Within Functional Limits for tasks assessed    Balance     End of Session PT - End of Session Equipment Utilized During Treatment: Gait belt Activity Tolerance: Patient tolerated treatment well Patient left: in chair;with call bell/phone within reach Nurse Communication: Mobility status;Other (comment) (pt nausea)   GP     Rubel Heckard 05/16/2013, 12:20 PM

## 2013-05-16 NOTE — Discharge Summary (Signed)
Physician Discharge Summary   Patient ID: Andrea Vasquez MRN: [id number] DOB/AGE: [birth year] 76 y.o.  Admit date: 05/14/2013 Discharge date: 05/16/2013  Primary Diagnosis:  Osteoarthritis of the Right hip.   Admission Diagnoses:  Past Medical History  Diagnosis Date  . Hypercholesteremia     controlled by medicine  . Hypertension   . Goiter     small on right side, monitored  . Depression     mild  . Arthritis   . Cancer 1992    right breast  . Dysrhythmia     "heart fluttering-been checked out, nothing wrong"   Discharge Diagnoses:   Principal Problem:   OA (osteoarthritis) of hip Active Problems:   Hypokalemia   Postoperative anemia due to acute blood loss  Estimated body mass index is 19.37 kg/(m^2) as calculated from the following:   Height as of this encounter: _0  (1.778 m).   Weight as of this encounter: 61.236 kg (135 lb).  Procedure(s) (LRB): RIGHT TOTAL HIP ARTHROPLASTY ANTERIOR APPROACH (Right)   Consults: None  HPI: [place] H Kalama is a 78 y.o. female who has advanced end-  stage arthritis of his Right hip with progressively worsening pain and  dysfunction.The patient has failed nonoperative management and presents for  total hip arthroplasty.  Laboratory Data: Admission on 05/14/2013, Discharged on 05/16/2013  Component Date Value Ref Range Status  . WBC 05/15/2013 12.0* 4.0 - 10.5 K/uL Final  . RBC 05/15/2013 3.64* 3.87 - 5.11 MIL/uL Final  . Hemoglobin 05/15/2013 11.3* 12.0 - 15.0 g/dL Final  . HCT 05/15/2013 35.2* 36.0 - 46.0 % Final  . MCV 05/15/2013 96.7  78.0 - 100.0 fL Final  . MCH 05/15/2013 31.0  26.0 - 34.0 pg Final  . MCHC 05/15/2013 32.1  30.0 - 36.0 g/dL Final  . RDW 05/15/2013 12.8  11.5 - 15.5 % Final  . Platelets 05/15/2013 229  150 - 400 K/uL Final  . Sodium 05/15/2013 141  137 - 147 mEq/L Final  . Potassium 05/15/2013 3.6* 3.7 - 5.3 mEq/L Final  . Chloride 05/15/2013 106  96 - 112 mEq/L Final  . CO2 05/15/2013 25  19  - 32 mEq/L Final  . Glucose, Bld 05/15/2013 143* 70 - 99 mg/dL Final  . BUN 05/15/2013 10  6 - 23 mg/dL Final  . Creatinine, Ser 05/15/2013 0.58  0.50 - 1.10 mg/dL Final  . Calcium 05/15/2013 8.3* 8.4 - 10.5 mg/dL Final  . GFR calc non Af Amer 05/15/2013 88* >90 mL/min Final  . GFR calc Af Amer 05/15/2013 >90  >90 mL/min Final   Comment: (NOTE)                          The eGFR has been calculated using the CKD EPI equation.                          This calculation has not been validated in all clinical situations.                          eGFR's persistently <90 mL/min signify possible Chronic Kidney                          Disease.  . WBC 05/16/2013 14.5* 4.0 - 10.5 K/uL Final  . RBC 05/16/2013 3.71* 3.87 - 5.11 MIL/uL Final  . Hemoglobin 05/16/2013  11.8* 12.0 - 15.0 g/dL Final  . HCT 05/16/2013 35.8* 36.0 - 46.0 % Final  . MCV 05/16/2013 96.5  78.0 - 100.0 fL Final  . MCH 05/16/2013 31.8  26.0 - 34.0 pg Final  . MCHC 05/16/2013 33.0  30.0 - 36.0 g/dL Final  . RDW 05/16/2013 13.1  11.5 - 15.5 % Final  . Platelets 05/16/2013 259  150 - 400 K/uL Final  . Sodium 05/16/2013 144  137 - 147 mEq/L Final  . Potassium 05/16/2013 4.1  3.7 - 5.3 mEq/L Final  . Chloride 05/16/2013 109  96 - 112 mEq/L Final  . CO2 05/16/2013 27  19 - 32 mEq/L Final  . Glucose, Bld 05/16/2013 105* 70 - 99 mg/dL Final  . BUN 05/16/2013 7  6 - 23 mg/dL Final  . Creatinine, Ser 05/16/2013 0.57  0.50 - 1.10 mg/dL Final  . Calcium 05/16/2013 8.6  8.4 - 10.5 mg/dL Final  . GFR calc non Af Amer 05/16/2013 88* >90 mL/min Final  . GFR calc Af Amer 05/16/2013 >90  >90 mL/min Final   Comment: (NOTE)                          The eGFR has been calculated using the CKD EPI equation.                          This calculation has not been validated in all clinical situations.                          eGFR's persistently <90 mL/min signify possible Chronic Kidney                          Disease.  Hospital Outpatient Visit  on 05/08/2013  Component Date Value Ref Range Status  . MRSA, PCR 05/08/2013 NEGATIVE  NEGATIVE Final  . Staphylococcus aureus 05/08/2013 NEGATIVE  NEGATIVE Final   Comment:                                 The Xpert SA Assay (FDA                          approved for NASAL specimens                          in patients over 16 years of age),                          is one component of                          a comprehensive surveillance                          program.  Test performance has                          been validated by [employer] for patients greater  than or equal to 56 year old.                          It is not intended                          to diagnose infection nor to                          guide or monitor treatment.  Marland Kitchen aPTT 05/08/2013 31  24 - 37 seconds Final  . WBC 05/08/2013 10.4  4.0 - 10.5 K/uL Final  . RBC 05/08/2013 4.52  3.87 - 5.11 MIL/uL Final  . Hemoglobin 05/08/2013 14.2  12.0 - 15.0 g/dL Final  . HCT 05/08/2013 44.1  36.0 - 46.0 % Final  . MCV 05/08/2013 97.6  78.0 - 100.0 fL Final  . MCH 05/08/2013 31.4  26.0 - 34.0 pg Final  . MCHC 05/08/2013 32.2  30.0 - 36.0 g/dL Final  . RDW 05/08/2013 12.8  11.5 - 15.5 % Final  . Platelets 05/08/2013 321  150 - 400 K/uL Final  . Sodium 05/08/2013 142  137 - 147 mEq/L Final  . Potassium 05/08/2013 4.1  3.7 - 5.3 mEq/L Final  . Chloride 05/08/2013 103  96 - 112 mEq/L Final  . CO2 05/08/2013 27  19 - 32 mEq/L Final  . Glucose, Bld 05/08/2013 99  70 - 99 mg/dL Final  . BUN 05/08/2013 11  6 - 23 mg/dL Final  . Creatinine, Ser 05/08/2013 0.53  0.50 - 1.10 mg/dL Final  . Calcium 05/08/2013 9.7  8.4 - 10.5 mg/dL Final  . Total Protein 05/08/2013 7.4  6.0 - 8.3 g/dL Final  . Albumin 05/08/2013 3.9  3.5 - 5.2 g/dL Final  . AST 05/08/2013 17  0 - 37 U/L Final  . ALT 05/08/2013 23  0 - 35 U/L Final  . Alkaline Phosphatase 05/08/2013 68  39 - 117 U/L Final  .  Total Bilirubin 05/08/2013 0.6  0.3 - 1.2 mg/dL Final  . GFR calc non Af Amer 05/08/2013 >90  >90 mL/min Final  . GFR calc Af Amer 05/08/2013 >90  >90 mL/min Final   Comment: (NOTE)                          The eGFR has been calculated using the CKD EPI equation.                          This calculation has not been validated in all clinical situations.                          eGFR's persistently <90 mL/min signify possible Chronic Kidney                          Disease.  Marland Kitchen Prothrombin Time 05/08/2013 12.2  11.6 - 15.2 seconds Final  . INR 05/08/2013 0.92  0.00 - 1.49 Final  . ABO/RH(D) 05/08/2013 O POS   Final  . Antibody Screen 05/08/2013 NEG   Final  . Sample Expiration 05/08/2013 05/17/2013   Final  . Color, Urine 05/08/2013 YELLOW  YELLOW Final  . APPearance 05/08/2013 CLEAR  CLEAR Final  . Specific Gravity, Urine 05/08/2013 1.020  1.005 - 1.030  Final  . pH 05/08/2013 5.5  5.0 - 8.0 Final  . Glucose, UA 05/08/2013 NEGATIVE  NEGATIVE mg/dL Final  . Hgb urine dipstick 05/08/2013 NEGATIVE  NEGATIVE Final  . Bilirubin Urine 05/08/2013 NEGATIVE  NEGATIVE Final  . Ketones, ur 05/08/2013 NEGATIVE  NEGATIVE mg/dL Final  . Protein, ur 05/08/2013 NEGATIVE  NEGATIVE mg/dL Final  . Urobilinogen, UA 05/08/2013 0.2  0.0 - 1.0 mg/dL Final  . Nitrite 05/08/2013 NEGATIVE  NEGATIVE Final  . Leukocytes, UA 05/08/2013 NEGATIVE  NEGATIVE Final   MICROSCOPIC NOT DONE ON URINES WITH NEGATIVE PROTEIN, BLOOD, LEUKOCYTES, NITRITE, OR GLUCOSE <1000 mg/dL.  . ABO/RH(D) 05/08/2013 O POS   Final     X-Rays:Dg Chest 2 View  05/08/2013   CLINICAL DATA:  Preop for right hip arthroplasty  EXAM: CHEST  2 VIEW  COMPARISON:  None.  FINDINGS: Cardiomediastinal silhouette is unremarkable. Mild hyperinflation. No acute infiltrate or pleural effusion. Osteopenia and mild degenerative changes thoracic spine. Surgical clips are noted in right axilla.  IMPRESSION: No active cardiopulmonary disease.   Electronically Signed    By: Lahoma Crocker M.D.   On: 05/08/2013 11:48   Dg Hip Complete Right  05/08/2013   CLINICAL DATA:  Preop right total hip arthroplasty.  EXAM: RIGHT HIP - COMPLETE 2+ VIEW  COMPARISON:  None.  FINDINGS: Advanced degenerative change in the right hip joint with marked cartilage loss. There is subchondral cystic change and osteophyte formation. Negative for fracture.  Mild to moderate degenerative change in the left hip joint.  IMPRESSION: Advanced osteoarthritis in the right hip.   Electronically Signed   By: Franchot Gallo M.D.   On: 05/08/2013 11:59   Dg Pelvis Portable  05/14/2013   CLINICAL DATA:  Postop right total hip arthroplasty.  EXAM: PORTABLE PELVIS 1-2 VIEWS  COMPARISON:  Radiographs 05/08/2013.  FINDINGS: Interval right total hip arthroplasty. The hardware appears well positioned. There is no evidence of acute fracture or dislocation. A surgical drain is in place. There is soft tissue emphysema surrounding the hip. The bones are demineralized. There are stable mild degenerative changes at the left hip, symphysis pubis and both sacroiliac joints.  IMPRESSION: No demonstrated complication following right total hip arthroplasty.   Electronically Signed   By: Camie Patience M.D.   On: 05/14/2013 15:45   Dg C-arm 1-60 Min-no Report  05/14/2013   CLINICAL DATA: INTRAOP   C-ARM 1-60 MINUTES  Fluoroscopy was utilized by the requesting physician.  No radiographic  interpretation.     EKG:No orders found for this or any previous visit.   Hospital Course: Patient was admitted to [facility] and taken to the OR and underwent the above state procedure without complications.  Patient tolerated the procedure well and was later transferred to the recovery room and then to the orthopaedic floor for postoperative care.  They were given PO and IV analgesics for pain control following their surgery.  They were given 24 hours of postoperative antibiotics of  Anti-infectives   Start     Dose/Rate Route  Frequency Ordered Stop   05/14/13 1800  ceFAZolin (ANCEF) IVPB 1 g/50 mL premix     1 g 100 mL/hr over 30 Minutes Intravenous Every 6 hours 05/14/13 1614 05/14/13 2356   05/14/13 1116  ceFAZolin (ANCEF) IVPB 2 g/50 mL premix     2 g 100 mL/hr over 30 Minutes Intravenous On call to O.R. 05/14/13 1116 05/14/13 1302     and started on DVT prophylaxis in the  form of Xarelto.   PT and OT were ordered for total hip protocol.  The patient was allowed to be WBAT with therapy. Discharge planning was consulted to help with postop disposition and equipment needs.  Patient had a decent night on the evening of surgery.  They started to get up OOB with therapy on day one.  Hemovac drain was pulled without difficulty.  Continued to work with therapy into day two.  Dressing was changed on day two and the incision was healing well. Patient was seen in rounds by Dr. Wynelle Link and was ready to go home later that same day.  Discharge home with home health  Diet - Cardiac diet  Follow up - in 2 weeks  Activity - WBAT  Disposition - Home  Condition Upon Discharge - Good  D/C Meds - See DC Summary  DVT Prophylaxis - Xarelto       Discharge Orders   Future Orders Complete By Expires   Call MD / Call 911  As directed    Comments:     If you experience chest pain or shortness of breath, CALL 911 and be transported to the hospital emergency room.  If you develope a fever above 101 F, pus (white drainage) or increased drainage or redness at the wound, or calf pain, call your surgeon's office.   Change dressing  As directed    Comments:     Change dressing daily with sterile 4 x 4 inch gauze dressing and apply TED hose. Do not submerge the incision under water.   Change dressing  As directed    Comments:     You may change your dressing dressing daily with sterile 4 x 4 inch gauze dressing and paper tape.  Do not submerge the incision under water.   Constipation Prevention  As directed    Comments:     Drink  plenty of fluids.  Prune juice may be helpful.  You may use a stool softener, such as Colace (over the counter) 100 mg twice a day.  Use MiraLax (over the counter) for constipation as needed.   Diet - low sodium heart healthy  As directed    Discharge instructions  As directed    Comments:     Pick up stool softner and laxative for home. Do not submerge incision under water. May shower. Continue to use ice for pain and swelling from surgery.  Total Hip Protocol.  Take Xarelto for two and a half more weeks, then discontinue Xarelto. Once the patient has completed the blood thinner regimen, then take a Baby 81 mg Aspirin daily for four more weeks.   Do not put a pillow under the knee. Place it under the heel.  As directed    Do not sit on low chairs, stoools or toilet seats, as it may be difficult to get up from low surfaces  As directed    Driving restrictions  As directed    Comments:     No driving until released by the physician.   Increase activity slowly as tolerated  As directed    Lifting restrictions  As directed    Comments:     No lifting until released by the physician.   Patient may shower  As directed    Comments:     You may shower without a dressing once there is no drainage.  Do not wash over the wound.  If drainage remains, do not shower until drainage stops.   TED  hose  As directed    Comments:     Use stockings (TED hose) for 3 weeks on both leg(s).  You may remove them at night for sleeping.   TED hose  As directed    Comments:     Use stockings (TED hose) for 3 weeks on both leg(s).  You may remove them at night for sleeping.   Weight bearing as tolerated  As directed    Questions:     Laterality:     Extremity:         Medication List         acetaminophen 650 MG CR tablet  Commonly known as:  TYLENOL  Take 1,300 mg by mouth 2 (two) times daily.     losartan 100 MG tablet  Commonly known as:  COZAAR  Take 100 mg by mouth every evening.      methocarbamol 500 MG tablet  Commonly known as:  ROBAXIN  Take 1 tablet (500 mg total) by mouth every 6 (six) hours as needed for muscle spasms.     mirtazapine 15 MG tablet  Commonly known as:  REMERON  Take 15 mg by mouth at bedtime.     oxyCODONE 5 MG immediate release tablet  Commonly known as:  Oxy IR/ROXICODONE  Take 1-2 tablets (5-10 mg total) by mouth every 3 (three) hours as needed for moderate pain or severe pain.     PRESERVISION AREDS PO  Take 1 tablet by mouth daily.     rivaroxaban 10 MG Tabs tablet  Commonly known as:  XARELTO  - Take 1 tablet (10 mg total) by mouth daily with breakfast. Take Xarelto for two and a half more weeks, then discontinue Xarelto.  - Once the patient has completed the blood thinner regimen, then take a Baby 81 mg Aspirin daily for four more weeks.     rosuvastatin 5 MG tablet  Commonly known as:  CRESTOR  Take 5 mg by mouth every evening.     traMADol 50 MG tablet  Commonly known as:  ULTRAM  Take 1 tablet (50 mg total) by mouth every 6 (six) hours as needed (mild to moderate).       Follow-up Information   Follow up with Gearlean Alf, MD. Schedule an appointment as soon as possible for a visit in 2 weeks.   Specialty:  Orthopedic Surgery   Contact information:   [address] [place] [address] [phone]       Signed: Mickel Crow 06/04/2013, 10:27 PM

## 2013-05-16 NOTE — Care Management Note (Signed)
    Page 1 of 2   05/16/2013     10:49:07 AM   CARE MANAGEMENT NOTE 05/16/2013  Patient:  LEILYN, FRAYRE   Account Number:  [id number]  Date Initiated:  05/16/2013  Documentation initiated by:  Sherrin Daisy  Subjective/Objective Assessment:   dx rt hip replacemnt-anterior approach     Action/Plan:   CM spoke with patient. Plans are for her to return to her home where daughter will be caregiver. She already has commode seat, and seated 4 wheeled walker but will need RW that was recommended by PT.   Anticipated DC Date:  05/16/2013   Anticipated DC Plan:  [place]  CM consult      PAC Choice  [place]   Choice offered to / List presented to:  C-1 Patient   DME arranged  Vassie Moselle      DME agency  [place] arranged  HH-2 PT      [place]   Status of service:  Completed, signed off Medicare Important Message given?  NA - LOS <3 / Initial given by admissions (If response is "NO", the following Medicare IM given date fields will be blank) Date Medicare IM given:   Date Additional Medicare IM given:    Discharge Disposition:  [place]  Per UR Regulation:    If discussed at Long Length of Stay Meetings, dates discussed:    Comments:  05/16/2013 Sherrin Daisy BSN RN CCM [phone] [facility] WILL PROVIDE HHPT SERVICES WITH START TOMORROW.

## 2013-07-02 ENCOUNTER — Encounter (HOSPITAL_COMMUNITY): Payer: Self-pay | Admitting: Emergency Medicine

## 2013-07-02 ENCOUNTER — Emergency Department (HOSPITAL_COMMUNITY)
Admission: EM | Admit: 2013-07-02 | Discharge: 2013-07-02 | Disposition: A | Discharge status: Home / Self Care | Payer: Medicare Other | Source: Home / Self Care | Attending: Family Medicine | Admitting: Family Medicine

## 2013-07-02 DIAGNOSIS — L8990 Pressure ulcer of unspecified site, unspecified stage: Secondary | ICD-10-CM

## 2013-07-02 DIAGNOSIS — L89609 Pressure ulcer of unspecified heel, unspecified stage: Secondary | ICD-10-CM

## 2013-07-02 DIAGNOSIS — L89619 Pressure ulcer of right heel, unspecified stage: Secondary | ICD-10-CM

## 2013-07-02 NOTE — Discharge Instructions (Signed)
Pressure Ulcer A pressure ulcer is a sore that has formed from the breakdown of skin and exposure of deeper layers of tissue. It develops in areas of the body where there is unrelieved pressure. Pressure ulcers are usually found over a bony area, such as the shoulder blades, spine, lower back, hips, knees, ankles, and heels. Pressure ulcers vary in severity. Your health care provider may determine the severity (stage) of your pressure ulcer. The stages include:  Stage I The skin is red, and when the skin is pressed, it stays red.  Stage II The top layer of skin is gone, and there is a shallow, pink ulcer.  Stage III The ulcer becomes deeper, and it is more difficult to see the whole wound. Also, there may be yellow or brown parts, as well as pink and red parts.  Stage IV The ulcer may be deep and red, pink, brown, white, or yellow. Bone or muscle may be seen.  Unstageable pressure ulcer The ulcer is covered almost completely with black, brown, or yellow tissue. It is not known how deep the ulcer is or what stage it is until this covering comes off.  Suspected deep tissue injury A person's skin can be injured from pressure or pulling on the skin when his or her position is changed. The skin appears purple or maroon. There may not be an opening in the skin, but there could be a blood-filled blister. This deep tissue injury is often difficult to see in people with darker skin tones. The site may open and become deeper in time. However, early interventions will help the area heal and may prevent the area from opening. CAUSES  Pressure ulcers are caused by pressure against the skin that limits the flow of blood to the skin and nearby tissues. There are many risk factors that can lead to pressure sores. RISK FACTORS  Decreased ability to move.  Decreased ability to feel pain or discomfort.  Excessive skin moisture from urine, stool, sweat, or secretions.  Poor  nutrition.  Dehydration.  Tobacco, drug, or alcohol abuse.  Having someone pull on bedsheets that are under you, such as when health care workers are changing your position in a hospital bed.  Obesity.  Increased adult age.  Age of less than 2 years.  Premature newborns.  Hospitalization in a critical care unit for longer than 4 days with use of medical devices.  Prolonged use of medical devices.  Critical illness.  Anemia.  Traumatic brain injury.  Spinal cord injury.  Stroke.  Diabetes.  Poor blood glucose control.  Low blood pressure (hypotension).  Low oxygen levels.  Medicines that reduce blood flow.  Infection. DIAGNOSIS  Your health care provider will diagnose your pressure ulcer based on its appearance. The health care provider may determine the stage of your pressure ulcer as well. Tests may be done to check for infection, to assess your circulation, or to check for other diseases, such as diabetes. TREATMENT  Treatment of your pressure ulcer begins with determining what stage the ulcer is in. Your treatment team may include your health care provider, a wound care specialist, a nutritionist, a physical therapist, and a surgeon. Possible treatments may include:   Moving or repositioning every 1 2 hours.  Using beds or mattresses to shift your body weight and pressure points frequently.  Improving your diet.  Cleaning and bandaging (dressing) the open wound.  Giving antibiotic medicines.  Removing damaged tissue.  Surgery and sometimes skin grafts. HOME CARE   INSTRUCTIONS  If you were hospitalized, follow the care plan that was started in the hospital.  Avoid staying in the same position for more than 2 hours. Use padding, devices, or mattresses to cushion your pressure points as directed by your health care provider.  Eat a well-balanced diet. Take nutritional supplements and vitamins as directed by your health care provider.  Keep all  follow-up appointments.  Only take over-the-counter or prescription medicines for pain, fever, or discomfort as directed by your health care provider. SEEK MEDICAL CARE IF:   Your pressure ulcer is not improving.  You do not know how to care for your pressure ulcer.  You notice other areas of redness on your skin. SEEK IMMEDIATE MEDICAL CARE IF:   You have increasing redness, swelling, or pain in your pressure ulcer.  You notice pus coming from your pressure ulcer.  You have a fever.  You notice a bad smell coming from the wound or dressing.  Your pressure ulcer opens up again. Document Released: 03/27/2005 Document Revised: 01/15/2013 Document Reviewed: 12/02/2012 [employer] Patient Information 2014 [employer].  

## 2013-07-02 NOTE — ED Provider Notes (Signed)
Medical screening examination/treatment/procedure(s) were performed by a resident physician or non-physician practitioner and as the supervising physician I was immediately available for consultation/collaboration.  Lynne Leader, MD    Gregor Hams, MD 07/02/13 [phone]

## 2013-07-02 NOTE — ED Notes (Signed)
Unhealed blister on right heel.  Marland Kitchen  Here for reevaluation

## 2013-07-02 NOTE — ED Provider Notes (Signed)
CSN: [id number]     Arrival date & time 07/02/13  1025 History   First MD Initiated Contact with Patient 07/03/74 1106     No chief complaint on file.  (Consider location/radiation/quality/duration/timing/severity/associated sxs/prior Treatment) HPI Comments: Patient reports that she underwent a right THR on 05-14-2013 and while in the hospital she developed pressure sores at both posterior heel areas. The lesion on her left heel has nearly healed but the lesion on her right heel persists. No hx of DM, patient is a non-smoker and no known hx of vascular insufficiency. Has already contacted [facility] and is scheduled for initial visit in one week. Presents to [facility] for evaluations. Wants to know if it is ok for her to wait one week to be seen by wound care center and to make sure lesions are not infected. Denies pain, fever, chills, or drainage from lesions. Received tetanus booster about 2 years ago.  The history is provided by the patient.    Past Medical History  Diagnosis Date  . Hypercholesteremia     controlled by medicine  . Hypertension   . Goiter     small on right side, monitored  . Depression     mild  . Arthritis   . Cancer 1992    right breast  . Dysrhythmia     "heart fluttering-been checked out, nothing wrong"   Past Surgical History  Procedure Laterality Date  . Breast lumpectomy Right 1992  . Back surgery  2002    lumbar, herniated disc  . Tonsillectomy  age 78  . Total hip arthroplasty Right 05/14/2013    Procedure: RIGHT TOTAL HIP ARTHROPLASTY ANTERIOR APPROACH;  Surgeon: Gearlean Alf, MD;  Location: [facility];  Service: Orthopedics;  Laterality: Right;   No family history on file. History  Substance Use Topics  . Smoking status: Former Smoker -- 0.25 packs/day for 10 years    Types: Cigarettes    Quit date: 04/10/1968  . Smokeless tobacco: Never Used  . Alcohol Use: Yes     Comment: glass of wine occasional   OB History   Grav Para Term  Preterm Abortions TAB SAB Ect Mult Living                 Review of Systems  All other systems reviewed and are negative.    Allergies  Review of patient's allergies indicates no known allergies.  Home Medications   Current Outpatient Rx  Name  Route  Sig  Dispense  Refill  . acetaminophen (TYLENOL) 650 MG CR tablet   Oral   Take 1,300 mg by mouth 2 (two) times daily.         Marland Kitchen losartan (COZAAR) 100 MG tablet   Oral   Take 100 mg by mouth every evening.         . methocarbamol (ROBAXIN) 500 MG tablet   Oral   Take 1 tablet (500 mg total) by mouth every 6 (six) hours as needed for muscle spasms.   80 tablet   0   . mirtazapine (REMERON) 15 MG tablet   Oral   Take 15 mg by mouth at bedtime.         . Multiple Vitamins-Minerals (PRESERVISION AREDS PO)   Oral   Take 1 tablet by mouth daily.         Marland Kitchen oxyCODONE (OXY IR/ROXICODONE) 5 MG immediate release tablet   Oral   Take 1-2 tablets (5-10 mg total) by mouth  every 3 (three) hours as needed for moderate pain or severe pain.   80 tablet   0   . rivaroxaban (XARELTO) 10 MG TABS tablet   Oral   Take 1 tablet (10 mg total) by mouth daily with breakfast. Take Xarelto for two and a half more weeks, then discontinue Xarelto. Once the patient has completed the blood thinner regimen, then take a Baby 81 mg Aspirin daily for four more weeks.   19 tablet   0   . rosuvastatin (CRESTOR) 5 MG tablet   Oral   Take 5 mg by mouth every evening.         . traMADol (ULTRAM) 50 MG tablet   Oral   Take 1 tablet (50 mg total) by mouth every 6 (six) hours as needed (mild to moderate).   60 tablet   1    BP 153/86  Pulse 73  Temp(Src) 98.6 F (37 C) (Oral)  Resp 16  SpO2 100% Physical Exam  Nursing note and vitals reviewed. Constitutional: She is oriented to person, place, and time. She appears well-developed and well-nourished.  HENT:  Head: Normocephalic and atraumatic.  Eyes: Conjunctivae are normal.   Cardiovascular: Normal rate.   1+ DP and PT pulses at right foot.  Pulmonary/Chest: Effort normal.  Musculoskeletal: Normal range of motion.  Neurological: She is alert and oriented to person, place, and time.  Skin: Skin is warm and dry.  1.2 x1.0 cm ulcerated lesion at right posterior heel. No clinical indication of abscess or cellulitis.   Psychiatric: She has a normal mood and affect. Her behavior is normal.    ED Course  Procedures (including critical care time) Labs Review Labs Reviewed - No data to display Imaging Review No results found.   MDM   1. Decubitus ulcer of right heel    Patient declined xrays to investigate for subacute osteomyelitis of right heel. Advised to keep next week's follow up appointment at [facility] for evaluation.   [place], [place] 07/02/13 1200

## 2013-07-09 ENCOUNTER — Encounter (HOSPITAL_BASED_OUTPATIENT_CLINIC_OR_DEPARTMENT_OTHER): Payer: Medicare Other | Attending: General Surgery

## 2013-07-09 DIAGNOSIS — Z96649 Presence of unspecified artificial hip joint: Secondary | ICD-10-CM | POA: Insufficient documentation

## 2013-07-09 DIAGNOSIS — M069 Rheumatoid arthritis, unspecified: Secondary | ICD-10-CM | POA: Insufficient documentation

## 2013-07-09 DIAGNOSIS — L89609 Pressure ulcer of unspecified heel, unspecified stage: Secondary | ICD-10-CM | POA: Insufficient documentation

## 2013-07-09 DIAGNOSIS — I10 Essential (primary) hypertension: Secondary | ICD-10-CM | POA: Insufficient documentation

## 2013-07-09 DIAGNOSIS — L8992 Pressure ulcer of unspecified site, stage 2: Secondary | ICD-10-CM | POA: Insufficient documentation

## 2013-07-09 DIAGNOSIS — Z7982 Long term (current) use of aspirin: Secondary | ICD-10-CM | POA: Insufficient documentation

## 2013-07-09 DIAGNOSIS — Z79899 Other long term (current) drug therapy: Secondary | ICD-10-CM | POA: Insufficient documentation

## 2013-07-09 NOTE — Progress Notes (Signed)
[facility]  NAME:  Andrea Vasquez, Andrea Vasquez           ACCOUNT NO.:  [id number]  MEDICAL RECORD NO.:  [id number]      DATE OF BIRTH:  1936/01/29  PHYSICIAN:  Judene Companion, M.D.      VISIT DATE:  07/09/2013                                  OFFICE VISIT   This is a 78 year old lady who several months ago, underwent a right hip replacement and developed a pressure ulcer on her right heel that I am classifying as a stage II.  It is about 1 cm in size.  Otherwise, she is fairly healthy.  She does have rheumatoid arthritis and also has hypertension.  She takes losartan, tramadol, and vitamins.  She also takes an aspirin a day.  When she came here today, her blood pressure was 118/73, temperature 98.  She weighs 135 pounds and is 5 foot.  She will be treated with collagen, and we put an offloading heel cup on, and I think this should do the trick, and we will see her back here in a week.  Her diagnosis is pressure ulcer, right heel, stage II; and history of rheumatoid arthritis.     Judene Companion, M.D.     PP/MEDQ  D:  07/09/2013  T:  07/09/2013  Job:  [id number]

## 2013-08-12 ENCOUNTER — Encounter (HOSPITAL_BASED_OUTPATIENT_CLINIC_OR_DEPARTMENT_OTHER): Payer: Medicare Other | Attending: General Surgery

## 2013-08-12 DIAGNOSIS — L8992 Pressure ulcer of unspecified site, stage 2: Secondary | ICD-10-CM | POA: Insufficient documentation

## 2013-08-12 DIAGNOSIS — L89609 Pressure ulcer of unspecified heel, unspecified stage: Secondary | ICD-10-CM | POA: Insufficient documentation

## 2013-08-13 ENCOUNTER — Encounter (HOSPITAL_BASED_OUTPATIENT_CLINIC_OR_DEPARTMENT_OTHER): Payer: Medicare Other

## 2013-09-12 ENCOUNTER — Other Ambulatory Visit: Payer: Self-pay | Admitting: Endocrinology

## 2013-09-12 DIAGNOSIS — E049 Nontoxic goiter, unspecified: Secondary | ICD-10-CM

## 2013-09-15 ENCOUNTER — Encounter (INDEPENDENT_AMBULATORY_CARE_PROVIDER_SITE_OTHER): Payer: Self-pay

## 2013-09-15 ENCOUNTER — Ambulatory Visit
Admission: RE | Admit: 2013-09-15 | Discharge: 2013-09-15 | Disposition: A | Discharge status: Home / Self Care | Payer: Medicare Other | Source: Ambulatory Visit | Attending: Endocrinology | Admitting: Endocrinology

## 2013-09-15 DIAGNOSIS — E049 Nontoxic goiter, unspecified: Secondary | ICD-10-CM

## 2013-12-24 ENCOUNTER — Inpatient Hospital Stay (HOSPITAL_COMMUNITY)
Admission: EM | Admit: 2013-12-24 | Discharge: 2013-12-26 | DRG: 384 | Disposition: A | Discharge status: Home / Self Care | Payer: Medicare Other | Attending: Internal Medicine | Admitting: Internal Medicine

## 2013-12-24 ENCOUNTER — Encounter (HOSPITAL_COMMUNITY): Payer: Self-pay | Admitting: Emergency Medicine

## 2013-12-24 DIAGNOSIS — I10 Essential (primary) hypertension: Secondary | ICD-10-CM | POA: Diagnosis present

## 2013-12-24 DIAGNOSIS — Z853 Personal history of malignant neoplasm of breast: Secondary | ICD-10-CM | POA: Diagnosis not present

## 2013-12-24 DIAGNOSIS — E785 Hyperlipidemia, unspecified: Secondary | ICD-10-CM | POA: Diagnosis present

## 2013-12-24 DIAGNOSIS — E876 Hypokalemia: Secondary | ICD-10-CM | POA: Diagnosis present

## 2013-12-24 DIAGNOSIS — K259 Gastric ulcer, unspecified as acute or chronic, without hemorrhage or perforation: Principal | ICD-10-CM | POA: Diagnosis present

## 2013-12-24 DIAGNOSIS — E049 Nontoxic goiter, unspecified: Secondary | ICD-10-CM | POA: Diagnosis present

## 2013-12-24 DIAGNOSIS — Z87891 Personal history of nicotine dependence: Secondary | ICD-10-CM | POA: Diagnosis not present

## 2013-12-24 DIAGNOSIS — D62 Acute posthemorrhagic anemia: Secondary | ICD-10-CM | POA: Diagnosis present

## 2013-12-24 DIAGNOSIS — F3289 Other specified depressive episodes: Secondary | ICD-10-CM | POA: Diagnosis present

## 2013-12-24 DIAGNOSIS — Z7982 Long term (current) use of aspirin: Secondary | ICD-10-CM | POA: Diagnosis not present

## 2013-12-24 DIAGNOSIS — F329 Major depressive disorder, single episode, unspecified: Secondary | ICD-10-CM | POA: Diagnosis present

## 2013-12-24 DIAGNOSIS — Z96649 Presence of unspecified artificial hip joint: Secondary | ICD-10-CM | POA: Diagnosis not present

## 2013-12-24 DIAGNOSIS — K922 Gastrointestinal hemorrhage, unspecified: Secondary | ICD-10-CM | POA: Diagnosis present

## 2013-12-24 DIAGNOSIS — M1990 Unspecified osteoarthritis, unspecified site: Secondary | ICD-10-CM | POA: Diagnosis present

## 2013-12-24 DIAGNOSIS — Z79899 Other long term (current) drug therapy: Secondary | ICD-10-CM | POA: Diagnosis not present

## 2013-12-24 DIAGNOSIS — K920 Hematemesis: Secondary | ICD-10-CM | POA: Diagnosis present

## 2013-12-24 DIAGNOSIS — D500 Iron deficiency anemia secondary to blood loss (chronic): Secondary | ICD-10-CM | POA: Diagnosis present

## 2013-12-24 LAB — CBC
HCT: 30.7 % — ABNORMAL LOW (ref 36.0–46.0)
Hemoglobin: 10 g/dL — ABNORMAL LOW (ref 12.0–15.0)
MCH: 31 pg (ref 26.0–34.0)
MCHC: 32.6 g/dL (ref 30.0–36.0)
MCV: 95 fL (ref 78.0–100.0)
PLATELETS: 271 10*3/uL (ref 150–400)
RBC: 3.23 MIL/uL — AB (ref 3.87–5.11)
RDW: 13.5 % (ref 11.5–15.5)
WBC: 9.3 10*3/uL (ref 4.0–10.5)

## 2013-12-24 LAB — CBC WITH DIFFERENTIAL/PLATELET
Basophils Absolute: 0 10*3/uL (ref 0.0–0.1)
Basophils Relative: 0 % (ref 0–1)
EOS PCT: 0 % (ref 0–5)
Eosinophils Absolute: 0 10*3/uL (ref 0.0–0.7)
HEMATOCRIT: 32.1 % — AB (ref 36.0–46.0)
Hemoglobin: 10.5 g/dL — ABNORMAL LOW (ref 12.0–15.0)
LYMPHS ABS: 2 10*3/uL (ref 0.7–4.0)
LYMPHS PCT: 20 % (ref 12–46)
MCH: 30.6 pg (ref 26.0–34.0)
MCHC: 32.7 g/dL (ref 30.0–36.0)
MCV: 93.6 fL (ref 78.0–100.0)
MONO ABS: 0.7 10*3/uL (ref 0.1–1.0)
Monocytes Relative: 7 % (ref 3–12)
NEUTROS ABS: 7.4 10*3/uL (ref 1.7–7.7)
Neutrophils Relative %: 73 % (ref 43–77)
Platelets: 294 10*3/uL (ref 150–400)
RBC: 3.43 MIL/uL — ABNORMAL LOW (ref 3.87–5.11)
RDW: 13.3 % (ref 11.5–15.5)
WBC: 10.1 10*3/uL (ref 4.0–10.5)

## 2013-12-24 LAB — GLUCOSE, CAPILLARY
GLUCOSE-CAPILLARY: 127 mg/dL — AB (ref 70–99)
Glucose-Capillary: 117 mg/dL — ABNORMAL HIGH (ref 70–99)

## 2013-12-24 LAB — COMPREHENSIVE METABOLIC PANEL
ALT: 12 U/L (ref 0–35)
AST: 12 U/L (ref 0–37)
Albumin: 3.2 g/dL — ABNORMAL LOW (ref 3.5–5.2)
Alkaline Phosphatase: 54 U/L (ref 39–117)
Anion gap: 11 (ref 5–15)
BILIRUBIN TOTAL: 0.4 mg/dL (ref 0.3–1.2)
BUN: 32 mg/dL — ABNORMAL HIGH (ref 6–23)
CALCIUM: 8.5 mg/dL (ref 8.4–10.5)
CHLORIDE: 113 meq/L — AB (ref 96–112)
CO2: 23 mEq/L (ref 19–32)
Creatinine, Ser: 0.53 mg/dL (ref 0.50–1.10)
GFR calc non Af Amer: 90 mL/min — ABNORMAL LOW (ref >90)
GLUCOSE: 106 mg/dL — AB (ref 70–99)
Potassium: 3.5 mEq/L — ABNORMAL LOW (ref 3.7–5.3)
Sodium: 147 mEq/L (ref 137–147)
Total Protein: 5.9 g/dL — ABNORMAL LOW (ref 6.0–8.3)

## 2013-12-24 LAB — SAMPLE TO BLOOD BANK

## 2013-12-24 LAB — POC OCCULT BLOOD, ED: FECAL OCCULT BLD: POSITIVE — AB

## 2013-12-24 MED ORDER — ONDANSETRON HCL 4 MG/2ML IJ SOLN: 4.0000 mg | Freq: Four times a day (QID) | INTRAMUSCULAR | Status: DC | PRN

## 2013-12-24 MED ORDER — SODIUM CHLORIDE 0.9 % IV SOLN
INTRAVENOUS | Status: DC
  Administered 2013-12-24 – 2013-12-25 (×3): via INTRAVENOUS

## 2013-12-24 MED ORDER — ACETAMINOPHEN 650 MG RE SUPP: 650.0000 mg | Freq: Four times a day (QID) | RECTAL | Status: DC | PRN

## 2013-12-24 MED ORDER — ACETAMINOPHEN 325 MG PO TABS: 650.0000 mg | ORAL_TABLET | Freq: Four times a day (QID) | ORAL | Status: DC | PRN

## 2013-12-24 MED ORDER — SODIUM CHLORIDE 0.9 % IJ SOLN
3.0000 mL | Freq: Two times a day (BID) | INTRAMUSCULAR | Status: DC
  Administered 2013-12-25 – 2013-12-26 (×2): 3 mL via INTRAVENOUS

## 2013-12-24 MED ORDER — PANTOPRAZOLE SODIUM 40 MG IV SOLR: 40.0000 mg | Freq: Two times a day (BID) | INTRAVENOUS | Status: DC

## 2013-12-24 MED ORDER — SODIUM CHLORIDE 0.9 % IV SOLN
8.0000 mg/h | INTRAVENOUS | Status: DC
  Administered 2013-12-24 – 2013-12-25 (×2): 8 mg/h via INTRAVENOUS
  Filled 2013-12-24 (×4): qty 80

## 2013-12-24 MED ORDER — PANTOPRAZOLE SODIUM 40 MG IV SOLR
40.0000 mg | Freq: Once | INTRAVENOUS | Status: AC
Start: 2013-12-24 — End: 2013-12-24
  Administered 2013-12-24: 40 mg via INTRAVENOUS
  Filled 2013-12-24: qty 40

## 2013-12-24 MED ORDER — ONDANSETRON HCL 4 MG PO TABS: 4.0000 mg | ORAL_TABLET | Freq: Four times a day (QID) | ORAL | Status: DC | PRN

## 2013-12-24 MED ORDER — SODIUM CHLORIDE 0.9 % IV BOLUS (SEPSIS): 500.0000 mL | Freq: Once | INTRAVENOUS | Status: DC

## 2013-12-24 MED ORDER — SODIUM CHLORIDE 0.9 % IV SOLN
80.0000 mg | Freq: Once | INTRAVENOUS | Status: AC
  Administered 2013-12-24: 80 mg via INTRAVENOUS
  Filled 2013-12-24: qty 80

## 2013-12-24 NOTE — ED Notes (Signed)
Per EMS: pt reports cold sweats starting yesterday with one episode of dark brown vomiting and today cold sweats still present with one episode of tarry diarrhea. Pt is too weak to get out of bed and cannot tolerate liquids. CBG: 130

## 2013-12-24 NOTE — ED Notes (Signed)
pt reports cold sweats starting yesterday with one episode of dark brown vomiting and today cold sweats still present with one episode of tarry diarrhea. Pt is too weak to get out of bed and cannot tolerate liquids.

## 2013-12-24 NOTE — ED Notes (Signed)
She remains in no distress, and tells me she is comfortable.  I have been informed by our lab that they highly suspect IV fluid contamination in the samples they ran; so I re-order these tests and re-send them at this time.

## 2013-12-24 NOTE — H&P (Signed)
[facility] History and Physical  Andrea Vasquez QAS:[id number] DOB: Jan 11, 1936 DOA: 12/24/2013  Referring physician: ER physician. PCP: Horatio Pel, MD   Chief Complaint: Dark tarry stools.  HPI: Andrea Vasquez is a 78 y.o. female with history of hypertension, hyperlipidemia and osteoarthritis has had one episode of nausea vomiting yesterday. Patient states vomitus was coffee-ground/chocolate colored. Patient was having poor appetite since then but has not had any abdominal pain. Patient was feeling weak and dizzy. Today patient had a bowel movement which was dark and tarry. Patient came to the ER. Patient's hemoglobin was around 10. In February was around 11.8. Patient states over the last 6 months she has been using Mobic for osteoarthritis. Patient otherwise denies any chest pain or shortness of breath and presently is hemodynamically stable and will be admitted for GI bleed.   Review of Systems: As presented in the history of presenting illness, rest negative.  Past Medical History  Diagnosis Date  . Hypercholesteremia     controlled by medicine  . Hypertension   . Goiter     small on right side, monitored  . Depression     mild  . Arthritis   . Cancer 1992    right breast  . Dysrhythmia     "heart fluttering-been checked out, nothing wrong"   Past Surgical History  Procedure Laterality Date  . Breast lumpectomy Right 1992  . Back surgery  2002    lumbar, herniated disc  . Tonsillectomy  age 5  . Total hip arthroplasty Right 05/14/2013    Procedure: RIGHT TOTAL HIP ARTHROPLASTY ANTERIOR APPROACH;  Surgeon: Gearlean Alf, MD;  Location: [facility];  Service: Orthopedics;  Laterality: Right;   Social History:  reports that she quit smoking about 45 years ago. Her smoking use included Cigarettes. She has a 2.5 pack-year smoking history. She has never used smokeless tobacco. She reports that she drinks alcohol. She reports that she does not use illicit  drugs. Where does patient live home. Can patient participate in ADLs? Yes.  No Known Allergies  Family History: History reviewed. No pertinent family history.    Prior to Admission medications   Medication Sig Start Date End Date Taking? Authorizing Provider  aspirin 81 MG chewable tablet Chew 81 mg by mouth daily.   Yes Historical Provider, MD  losartan (COZAAR) 100 MG tablet Take 100 mg by mouth every evening.   Yes Historical Provider, MD  meloxicam (MOBIC) 15 MG tablet Take 15 mg by mouth daily.   Yes Historical Provider, MD  mirtazapine (REMERON) 15 MG tablet Take 15 mg by mouth at bedtime.   Yes Historical Provider, MD  raloxifene (EVISTA) 60 MG tablet Take 60 mg by mouth daily.   Yes Historical Provider, MD  rosuvastatin (CRESTOR) 5 MG tablet Take 5 mg by mouth every evening.   Yes Historical Provider, MD  traMADol (ULTRAM) 50 MG tablet Take 50 mg by mouth every 6 (six) hours as needed for moderate pain or severe pain (mild to moderate). 05/16/13  Yes Arlee Muslim, PA-C    Physical Exam: Filed Vitals:   12/24/13 1540 12/24/13 1747 12/24/13 2000  BP: 140/86 121/61 119/65  Pulse: 89 92 95  Temp: 98.5 F (36.9 C) 98.2 F (36.8 C) 98.2 F (36.8 C)  TempSrc: Oral Oral Oral  Resp: 16 16 12   SpO2: 100% 99% 100%     General:  Well-developed and nourished.  Eyes: Anicteric no pallor.  ENT: No discharge from the ears eyes  nose mouth.  Neck: No mass felt.  Cardiovascular: S1-S2 heard.  Respiratory: No rhonchi or crepitations.  Abdomen: Soft nontender bowel sounds present. No guarding or rigidity.  Skin: No rash.  Musculoskeletal: No edema.  Psychiatric: Appears normal.  Neurologic: Alert awake oriented to time place and person. Moves all extremities.  Labs on Admission:  Basic Metabolic Panel:  Recent Labs Lab 12/24/13 1736  NA 147  K 3.5*  CL 113*  CO2 23  GLUCOSE 106*  BUN 32*  CREATININE 0.53  CALCIUM 8.5   Liver Function Tests:  Recent Labs Lab  12/24/13 1736  AST 12  ALT 12  ALKPHOS 54  BILITOT 0.4  PROT 5.9*  ALBUMIN 3.2*   No results found for this basename: LIPASE, AMYLASE,  in the last 168 hours No results found for this basename: AMMONIA,  in the last 168 hours CBC:  Recent Labs Lab 12/24/13 1736  WBC 10.1  NEUTROABS 7.4  HGB 10.5*  HCT 32.1*  MCV 93.6  PLT 294   Cardiac Enzymes: No results found for this basename: CKTOTAL, CKMB, CKMBINDEX, TROPONINI,  in the last 168 hours  BNP (last 3 results) No results found for this basename: PROBNP,  in the last 8760 hours CBG: No results found for this basename: GLUCAP,  in the last 168 hours  Radiological Exams on Admission: No results found.   Assessment/Plan Active Problems:   GI bleed   Blood loss anemia   HLD (hyperlipidemia)   Hypertension   1. Acute GI bleed - most likely upper GI bleed possibly secondary to NSAIDs use. Patient is also on aspirin. At this time we'll keep patient n.p.o. in anticipation of possible EGD in a.m. Closely follow CBC for any further worsening of hemoglobin. Patient has been placed on Protonix infusion. Gentle hydration. Discontinue NSAIDs and aspirin. 2. Anemia probably from blood loss - closely follow CBC. 3. Hypertension - hold antihypertensives for now since patient was having mild weakness. 4. Hyperlipidemia - continue present medications once patient is able to take orally.    Code Status: Full code.  Family Communication: Patient's daughter at the bedside.  Disposition Plan: Admit to inpatient.    Sharlize Hoar N. [facility] Pager [phone].  If 7PM-7AM, please contact night-coverage [email or web address] Password [facility] 12/24/2013, 8:43 PM

## 2013-12-24 NOTE — ED Notes (Signed)
Bed: BO47 Expected date:  Expected time:  Means of arrival:  Comments: EMS- Elderly, tar colored diarrhea

## 2013-12-24 NOTE — ED Provider Notes (Signed)
CSN: [id number]     Arrival date & time 12/24/13  1528 History   First MD Initiated Contact with Patient 12/24/13 1537     Chief Complaint  Patient presents with  . Melena     (Consider location/radiation/quality/duration/timing/severity/associated sxs/prior Treatment) The history is provided by the patient.   patient presents with generalized weakness and black stool. States she has a daughter that had had diarrhea. She states she's felt weak for 2 days. She's had nausea she said she vomited some chocolate looking material. She states she had some dark stools and black tarry stool. No abdominal pain. Some fatigue. No chest pain. No trouble breathing. No other bleeding. She states she is only on baby aspirin for blood thinning. No fevers. No abdominal pain. Patient states she had a doctor's appointment today but was too weak to go to the doctor. She states she later called her doctor was told to go to the ER. She states her blood pressures have been running high over the last couple weeks at home.  Past Medical History  Diagnosis Date  . Hypercholesteremia     controlled by medicine  . Hypertension   . Goiter     small on right side, monitored  . Depression     mild  . Arthritis   . Cancer 1992    right breast  . Dysrhythmia     "heart fluttering-been checked out, nothing wrong"   Past Surgical History  Procedure Laterality Date  . Breast lumpectomy Right 1992  . Back surgery  2002    lumbar, herniated disc  . Tonsillectomy  age 18  . Total hip arthroplasty Right 05/14/2013    Procedure: RIGHT TOTAL HIP ARTHROPLASTY ANTERIOR APPROACH;  Surgeon: Gearlean Alf, MD;  Location: [facility];  Service: Orthopedics;  Laterality: Right;   History reviewed. No pertinent family history. History  Substance Use Topics  . Smoking status: Former Smoker -- 0.25 packs/day for 10 years    Types: Cigarettes    Quit date: 04/10/1968  . Smokeless tobacco: Never Used  . Alcohol Use: Yes      Comment: glass of wine occasional   OB History   Grav Para Term Preterm Abortions TAB SAB Ect Mult Living                 Review of Systems  Constitutional: Positive for diaphoresis, appetite change and fatigue. Negative for activity change.  Eyes: Negative for pain.  Respiratory: Negative for chest tightness and shortness of breath.   Cardiovascular: Negative for chest pain and leg swelling.  Gastrointestinal: Positive for nausea, vomiting and blood in stool. Negative for abdominal pain and diarrhea.  Genitourinary: Negative for flank pain.  Musculoskeletal: Negative for back pain and neck stiffness.  Skin: Negative for rash.  Neurological: Negative for weakness, numbness and headaches.  Hematological: Does not bruise/bleed easily.  Psychiatric/Behavioral: Negative for behavioral problems.      Allergies  Review of patient's allergies indicates no known allergies.  Home Medications   Prior to Admission medications   Medication Sig Start Date End Date Taking? Authorizing Provider  aspirin 81 MG chewable tablet Chew 81 mg by mouth daily.   Yes Historical Provider, MD  losartan (COZAAR) 100 MG tablet Take 100 mg by mouth every evening.   Yes Historical Provider, MD  meloxicam (MOBIC) 15 MG tablet Take 15 mg by mouth daily.   Yes Historical Provider, MD  mirtazapine (REMERON) 15 MG tablet Take 15 mg by mouth at bedtime.  Yes Historical Provider, MD  raloxifene (EVISTA) 60 MG tablet Take 60 mg by mouth daily.   Yes Historical Provider, MD  rosuvastatin (CRESTOR) 5 MG tablet Take 5 mg by mouth every evening.   Yes Historical Provider, MD  traMADol (ULTRAM) 50 MG tablet Take 50 mg by mouth every 6 (six) hours as needed for moderate pain or severe pain (mild to moderate). 05/16/13  Yes Arlee Muslim, PA-C   BP 153/72  Pulse 79  Temp(Src) 98.2 F (36.8 C) (Oral)  Resp 16  Ht 5\' 10"  (1.778 m)  Wt 139 lb 8.8 oz (63.3 kg)  BMI 20.02 kg/m2  SpO2 100% Physical Exam  Nursing note and  vitals reviewed. Constitutional: She is oriented to person, place, and time. She appears well-developed and well-nourished.  HENT:  Head: Normocephalic and atraumatic.  Eyes: Pupils are equal, round, and reactive to light.  Cardiovascular: Normal rate, regular rhythm and normal heart sounds.   No murmur heard. Pulmonary/Chest: Effort normal and breath sounds normal. No respiratory distress. She has no wheezes. She has no rales.  Abdominal: Soft. Bowel sounds are normal. She exhibits no distension. There is no tenderness. There is no rebound and no guarding.  Musculoskeletal: Normal range of motion.  Neurological: She is alert and oriented to person, place, and time. No cranial nerve deficit.  Skin: Skin is warm and dry. No pallor.  Psychiatric: She has a normal mood and affect. Her speech is normal.    ED Course  Procedures (including critical care time) Labs Review Labs Reviewed  CBC WITH DIFFERENTIAL - Abnormal; Notable for the following:    RBC 3.43 (*)    Hemoglobin 10.5 (*)    HCT 32.1 (*)    All other components within normal limits  COMPREHENSIVE METABOLIC PANEL - Abnormal; Notable for the following:    Potassium 3.5 (*)    Chloride 113 (*)    Glucose, Bld 106 (*)    BUN 32 (*)    Total Protein 5.9 (*)    Albumin 3.2 (*)    GFR calc non Af Amer 90 (*)    All other components within normal limits  CBC - Abnormal; Notable for the following:    RBC 3.23 (*)    Hemoglobin 10.0 (*)    HCT 30.7 (*)    All other components within normal limits  GLUCOSE, CAPILLARY - Abnormal; Notable for the following:    Glucose-Capillary 127 (*)    All other components within normal limits  GLUCOSE, CAPILLARY - Abnormal; Notable for the following:    Glucose-Capillary 117 (*)    All other components within normal limits  POC OCCULT BLOOD, ED - Abnormal; Notable for the following:    Fecal Occult Bld POSITIVE (*)    All other components within normal limits  CBC WITH DIFFERENTIAL  CBC   CBC  CBC  COMPREHENSIVE METABOLIC PANEL  CBC  SAMPLE TO BLOOD BANK    Imaging Review No results found.   EKG Interpretation   Date/Time:  Wednesday December 24 2013 17:37:34 EDT Ventricular Rate:  91 PR Interval:  159 QRS Duration: 92 QT Interval:  389 QTC Calculation: 479 R Axis:   -36 Text Interpretation:  Sinus rhythm Consider left atrial enlargement Left  axis deviation Confirmed by Alvino Chapel  MD, Jakeria Caissie [phone]) on 12/25/2013  12:28:09 AM      MDM   Final diagnoses:  Upper GI bleed  Acute posthemorrhagic anemia    Patient with GI bleeding melena. Mild anemia.  Vitals are reassuring. Will admit to internal medicine.    Jasper Riling. Alvino Chapel, MD 12/25/13 8110

## 2013-12-25 ENCOUNTER — Encounter (HOSPITAL_COMMUNITY): Payer: Self-pay | Admitting: *Deleted

## 2013-12-25 ENCOUNTER — Inpatient Hospital Stay (HOSPITAL_COMMUNITY): Payer: Medicare Other

## 2013-12-25 ENCOUNTER — Encounter (HOSPITAL_COMMUNITY): Payer: Self-pay

## 2013-12-25 ENCOUNTER — Ambulatory Visit (HOSPITAL_COMMUNITY): Admit: 2013-12-25 | Payer: Self-pay | Admitting: Gastroenterology

## 2013-12-25 ENCOUNTER — Encounter (HOSPITAL_COMMUNITY)
Admission: EM | Disposition: A | Discharge status: Home / Self Care | Payer: Self-pay | Source: Home / Self Care | Attending: Internal Medicine

## 2013-12-25 HISTORY — PX: ESOPHAGOGASTRODUODENOSCOPY: SHX5428

## 2013-12-25 LAB — CBC
HEMATOCRIT: 29.4 % — AB (ref 36.0–46.0)
HEMATOCRIT: 28 % — AB (ref 36.0–46.0)
Hemoglobin: 10.1 g/dL — ABNORMAL LOW (ref 12.0–15.0)
Hemoglobin: 9.2 g/dL — ABNORMAL LOW (ref 12.0–15.0)
MCH: 33.2 pg (ref 26.0–34.0)
MCH: 31.6 pg (ref 26.0–34.0)
MCHC: 34.4 g/dL (ref 30.0–36.0)
MCHC: 32.9 g/dL (ref 30.0–36.0)
MCV: 96.7 fL (ref 78.0–100.0)
MCV: 96.2 fL (ref 78.0–100.0)
PLATELETS: 229 10*3/uL (ref 150–400)
Platelets: 237 10*3/uL (ref 150–400)
RBC: 3.04 MIL/uL — ABNORMAL LOW (ref 3.87–5.11)
RBC: 2.91 MIL/uL — ABNORMAL LOW (ref 3.87–5.11)
RDW: 13.7 % (ref 11.5–15.5)
RDW: 13.6 % (ref 11.5–15.5)
WBC: 9.7 10*3/uL (ref 4.0–10.5)
WBC: 8.9 10*3/uL (ref 4.0–10.5)

## 2013-12-25 LAB — COMPREHENSIVE METABOLIC PANEL
ALBUMIN: 2.9 g/dL — AB (ref 3.5–5.2)
ALT: 9 U/L (ref 0–35)
AST: 11 U/L (ref 0–37)
Alkaline Phosphatase: 47 U/L (ref 39–117)
Anion gap: 9 (ref 5–15)
BILIRUBIN TOTAL: 0.5 mg/dL (ref 0.3–1.2)
BUN: 25 mg/dL — ABNORMAL HIGH (ref 6–23)
CHLORIDE: 115 meq/L — AB (ref 96–112)
CO2: 23 mEq/L (ref 19–32)
CREATININE: 0.56 mg/dL (ref 0.50–1.10)
Calcium: 8.2 mg/dL — ABNORMAL LOW (ref 8.4–10.5)
GFR calc Af Amer: >90 mL/min (ref >90)
GFR, EST NON AFRICAN AMERICAN: 88 mL/min — AB (ref >90)
Glucose, Bld: 103 mg/dL — ABNORMAL HIGH (ref 70–99)
Potassium: 3.1 mEq/L — ABNORMAL LOW (ref 3.7–5.3)
Sodium: 147 mEq/L (ref 137–147)
Total Protein: 5.3 g/dL — ABNORMAL LOW (ref 6.0–8.3)

## 2013-12-25 LAB — GLUCOSE, CAPILLARY
Glucose-Capillary: 113 mg/dL — ABNORMAL HIGH (ref 70–99)
Glucose-Capillary: 93 mg/dL (ref 70–99)
Glucose-Capillary: 92 mg/dL (ref 70–99)

## 2013-12-25 LAB — TSH: TSH: 0.114 u[IU]/mL — ABNORMAL LOW (ref 0.350–4.500)

## 2013-12-25 SURGERY — EGD (ESOPHAGOGASTRODUODENOSCOPY)
Anesthesia: Moderate Sedation

## 2013-12-25 MED ORDER — MIDAZOLAM HCL 10 MG/2ML IJ SOLN
INTRAMUSCULAR | Status: DC | PRN
  Administered 2013-12-25 (×2): 2 mg via INTRAVENOUS
  Administered 2013-12-25: 1 mg via INTRAVENOUS

## 2013-12-25 MED ORDER — BUTAMBEN-TETRACAINE-BENZOCAINE 2-2-14 % EX AERO
INHALATION_SPRAY | CUTANEOUS | Status: DC | PRN
  Administered 2013-12-25: 2 via TOPICAL

## 2013-12-25 MED ORDER — POTASSIUM CHLORIDE CRYS ER 20 MEQ PO TBCR
40.0000 meq | EXTENDED_RELEASE_TABLET | Freq: Once | ORAL | Status: AC
  Administered 2013-12-25: 40 meq via ORAL
  Filled 2013-12-25: qty 2

## 2013-12-25 MED ORDER — MIDAZOLAM HCL 10 MG/2ML IJ SOLN
INTRAMUSCULAR | Status: AC
  Filled 2013-12-25: qty 4

## 2013-12-25 MED ORDER — FENTANYL CITRATE 0.05 MG/ML IJ SOLN
INTRAMUSCULAR | Status: DC | PRN
  Administered 2013-12-25: 25 ug via INTRAVENOUS

## 2013-12-25 MED ORDER — FENTANYL CITRATE 0.05 MG/ML IJ SOLN
INTRAMUSCULAR | Status: AC
  Filled 2013-12-25: qty 2

## 2013-12-25 MED ORDER — PANTOPRAZOLE SODIUM 40 MG PO TBEC: 40.0000 mg | DELAYED_RELEASE_TABLET | Freq: Two times a day (BID) | ORAL | Status: DC

## 2013-12-25 MED ORDER — PANTOPRAZOLE SODIUM 40 MG PO TBEC
40.0000 mg | DELAYED_RELEASE_TABLET | Freq: Two times a day (BID) | ORAL | Status: DC
  Administered 2013-12-25 – 2013-12-26 (×2): 40 mg via ORAL
  Filled 2013-12-25 (×3): qty 1

## 2013-12-25 MED ORDER — SODIUM CHLORIDE 0.9 % IV SOLN: INTRAVENOUS | Status: DC

## 2013-12-25 NOTE — Care Management Note (Addendum)
    Page 1 of 1   12/26/2013     12:34:13 PM CARE MANAGEMENT NOTE 12/26/2013  Patient:  ANGELIYAH, KIRKEY   Account Number:  [id number]  Date Initiated:  12/25/2013  Documentation initiated by:  Dessa Phi  Subjective/Objective Assessment:   78 Y/O F ADMITTED W/GIB.     Action/Plan:   FROM HOME.   Anticipated DC Date:  12/26/2013   Anticipated DC Plan:  [place]  CM consult      Choice offered to / List presented to:             Status of service:  Completed, signed off Medicare Important Message given?   (If response is "NO", the following Medicare IM given date fields will be blank) Date Medicare IM given:   Medicare IM given by:   Date Additional Medicare IM given:   Additional Medicare IM given by:    Discharge Disposition:  HOME/SELF CARE  Per UR Regulation:  Reviewed for med. necessity/level of care/duration of stay  If discussed at [place] of Stay Meetings, dates discussed:    Comments:  12/26/13 Semaj Kham RN,BSN NCM [phone] D/C HOME NO [place].  12/25/13 Secret Kristensen RN,BSN NCM [phone] NO ANTICIPATED D/C NEEDS.

## 2013-12-25 NOTE — Consult Note (Signed)
Referring Provider: Clemetine Marker, MD ([facility]) Primary Care Physician:  Horatio Pel, MD Primary Gastroenterologist:  Dr. Watt Climes  Reason for Consultation:  GI bleeding  HPI: Andrea Vasquez is a 78 y.o. female admitted through the emergency room last night because of a 24 hour history of recurrent dark stools (several occasions), presyncope, diaphoresis, and one episode of coffee-ground emesis, in the setting of daily Mobic usage because of DJD, and daily 81 mg aspirin as primary prophylaxis.   No prior history of ulcer disease or GI bleeding, no prodromal dyspeptic symptomatology.   Since admission, her hemoglobin has declined from 10 to 9.2. She has a slightly elevated BUN of 25.   Past Medical History  Diagnosis Date  . Hypercholesteremia     controlled by medicine  . Hypertension   . Goiter     small on right side, monitored  . Depression     mild  . Arthritis   . Cancer 1992    right breast  . Dysrhythmia     "heart fluttering-been checked out, nothing wrong"    Past Surgical History  Procedure Laterality Date  . Breast lumpectomy Right 1992  . Back surgery  2002    lumbar, herniated disc  . Tonsillectomy  age 40  . Total hip arthroplasty Right 05/14/2013    Procedure: RIGHT TOTAL HIP ARTHROPLASTY ANTERIOR APPROACH;  Surgeon: Gearlean Alf, MD;  Location: [facility];  Service: Orthopedics;  Laterality: Right;    Prior to Admission medications   Medication Sig Start Date End Date Taking? Authorizing Provider  aspirin 81 MG chewable tablet Chew 81 mg by mouth daily.   Yes Historical Provider, MD  losartan (COZAAR) 100 MG tablet Take 100 mg by mouth every evening.   Yes Historical Provider, MD  meloxicam (MOBIC) 15 MG tablet Take 15 mg by mouth daily.   Yes Historical Provider, MD  mirtazapine (REMERON) 15 MG tablet Take 15 mg by mouth at bedtime.   Yes Historical Provider, MD  raloxifene (EVISTA) 60 MG tablet Take 60 mg by mouth daily.   Yes Historical  Provider, MD  rosuvastatin (CRESTOR) 5 MG tablet Take 5 mg by mouth every evening.   Yes Historical Provider, MD  traMADol (ULTRAM) 50 MG tablet Take 50 mg by mouth every 6 (six) hours as needed for moderate pain or severe pain (mild to moderate). 05/16/13  Yes Arlee Muslim, PA-C    Current Facility-Administered Medications  Medication Dose Route Frequency Provider Last Rate Last Dose  . 0.9 %  sodium chloride infusion   Intravenous Continuous Rise Patience, MD 125 mL/hr at 12/25/13 [phone]    . acetaminophen (TYLENOL) tablet 650 mg  650 mg Oral Q6H PRN Rise Patience, MD       Or  . acetaminophen (TYLENOL) suppository 650 mg  650 mg Rectal Q6H PRN Rise Patience, MD      . ondansetron [facility]) tablet 4 mg  4 mg Oral Q6H PRN Rise Patience, MD       Or  . ondansetron [facility]) injection 4 mg  4 mg Intravenous Q6H PRN Rise Patience, MD      . pantoprazole (PROTONIX) 80 mg in sodium chloride 0.9 % 250 mL (0.32 mg/mL) infusion  8 mg/hr Intravenous Continuous Rise Patience, MD 25 mL/hr at 12/25/13 4827 8 mg/hr at 12/25/13 0608  . [START ON 12/28/2013] pantoprazole (PROTONIX) injection 40 mg  40 mg Intravenous Q12H Rise Patience, MD      .  potassium chloride SA (K-DUR,KLOR-CON) CR tablet 40 mEq  40 mEq Oral Once Theodis Blaze, MD      . sodium chloride 0.9 % injection 3 mL  3 mL Intravenous Q12H Rise Patience, MD        Allergies as of 12/24/2013  . (No Known Allergies)    History reviewed. No pertinent family history.  History   Social History  . Marital Status: Divorced    Spouse Name: N/A    Number of Children: N/A  . Years of Education: N/A   Occupational History  . Not on file.   Social History Main Topics  . Smoking status: Former Smoker -- 0.25 packs/day for 10 years    Types: Cigarettes    Quit date: 04/10/1968  . Smokeless tobacco: Never Used  . Alcohol Use: Yes     Comment: glass of wine occasional  . Drug Use: No  . Sexual  Activity: Not on file   Other Topics Concern  . Not on file   Social History Narrative  . No narrative on file    Review of Systems: Globally negative. Specifically, negative for prodromal chronic anorexia, weight loss, dysphagia, abdominal pain, nausea vomiting, diarrhea, or rectal bleeding. She does have a slight tendency toward constipation. She has arthritis "all over" but no unusual recent joint effusions or skin rashes, no chest pain, shortness of breath, or chronic cough, no urinary symptoms such as dysuria  Physical Exam: Vital signs in last 24 hours: Temp:  [98.2 F (36.8 C)-98.5 F (36.9 C)] 98.4 F (36.9 C) (09/17 0503) Pulse Rate:  [79-95] 87 (09/17 0503) Resp:  [12-18] 18 (09/17 0503) BP: (119-153)/(61-86) 128/71 mmHg (09/17 0503) SpO2:  [99 %-100 %] 99 % (09/17 0503) Weight:  [63.3 kg (139 lb 8.8 oz)] 63.3 kg (139 lb 8.8 oz) (09/17 0503) Last BM Date: 12/24/13 General:   Alert,  Well-developed, well-nourished, pleasant and cooperative in NAD Head:  Normocephalic and atraumatic. Eyes:  Sclera clear, no icterus.   Conjunctiva pink. Mouth:   No ulcerations or lesions.  Oropharynx pink & moist. Neck:   No masses or thyromegaly. Lungs:  Clear throughout to auscultation.   No wheezes, crackles, or rhonchi. No evident respiratory distress. Heart:   Regular rate and rhythm; no murmurs, clicks, rubs,  or gallops. Abdomen:  Soft, nontender, and nondistended. No masses, hepatosplenomegaly or ventral hernias noted.  Msk:   Symmetrical without gross deformities. Extremities:   Without clubbing, cyanosis, or edema. Neurologic:  Alert and coherent;  grossly normal neurologically. Skin:  Intact without significant lesions or rashes. Cervical Nodes:  No significant cervical adenopathy. Psych:   Alert and cooperative. Normal mood and affect.  Intake/Output from previous day: 09/16 0701 - 09/17 0700 In: 1360 [I.V.:1360] Out: -  Intake/Output this shift:    Lab  Results:  Recent Labs  12/24/13 2215 12/25/13 0127 12/25/13 0454  WBC 9.3 9.7 8.9  HGB 10.0* 10.1* 9.2*  HCT 30.7* 29.4* 28.0*  PLT 271 229 237   BMET  Recent Labs  12/24/13 1736 12/25/13 0454  NA 147 147  K 3.5* 3.1*  CL 113* 115*  CO2 23 23  GLUCOSE 106* 103*  BUN 32* 25*  CREATININE 0.53 0.56  CALCIUM 8.5 8.2*   LFT  Recent Labs  12/25/13 0454  PROT 5.3*  ALBUMIN 2.9*  AST 11  ALT 9  ALKPHOS 47  BILITOT 0.5   PT/INR No results found for this basename: LABPROT, INR,  in the last  72 hours  Studies/Results: Dg Chest 2 View  12/25/2013   CLINICAL DATA:  Cough and sweats.  EXAM: CHEST  2 VIEW  COMPARISON:  1/29/ 15  FINDINGS: The heart size and mediastinal contours are within normal limits. Tortuous thoracic aorta. Chronic interstitial coarsening identified bilaterally. Both lungs are clear. The visualized skeletal structures are unremarkable.  IMPRESSION: 1. No acute cardiopulmonary abnormalities. 2. Tortuous aorta.   Electronically Signed   By: Kerby Moors M.D.   On: 12/25/2013 11:30    Impression: 1. Subacute upper GI bleeding, almost certainly the result of aspirin and nonsteroidal anti-inflammatory drug induced ulceration . No frank hemodynamic instability 2. Posthemorrhagic anemia, mild  Plan: 1. Continue PPI therapy 2. Endoscopic evaluation later today. Petra Kuba, purpose, and risks reviewed. Patient agreeable   LOS: 1 day   Stoy Fenn V  12/25/2013, 12:07 PM

## 2013-12-25 NOTE — Progress Notes (Signed)
The patient's endoscopy showed no blood, and a small clean-based antral ulcer, consistent with her aspirin and Mobic therapy.  She appears to be at very low risk of rebleeding.  We will recheck labs in the morning, but if stable, I would think that discharge would be appropriate.  In the meantime, I have converted her to oral Protonix, and am stopping IV fluids and allowing a solid diet.  Please see dictated procedure note for further details, and recommendations regarding post discharge care.  Cleotis Nipper, M.D. [phone]

## 2013-12-25 NOTE — Progress Notes (Signed)
Patient ID: Andrea Vasquez, female   DOB: 03/29/1936, 78 y.o.   MRN: [id number]  [facility] PROGRESS NOTE  Andrea Vasquez PFX:[id number] DOB: 11/22/1935 DOA: 12/24/2013 PCP: Horatio Pel, MD  Brief narrative: 78 y.o. female with history of hypertension, hyperlipidemia and osteoarthritis presented to [facility] ED after having nausea and one episode of coffee - ground emesis, dark stools. This was associated with dizziness and generalized weakness. Pt has been taking Mobic, tramadol, aspirin for pain.  [facility] admitted for further evaluation.    Assessment and Plan:    Active Problems:   Coffee ground emesis, dark stools - appears to be related to NSAID use along with aspirin - currently all on hold - GI consulted for possible EGD today, appreciate assistance - continue Protonix for now   Acute blood loss anemia - Hg at baseline appears to be 11 - 12 per records review - no current need for transfusion but will monitor H/H closely - repeat CBC in AM   Hypertension - reasonable inpatient control   Hypokalemia - will supplement and repeat BMP in AM  DVT prophylaxis  SCD's  Code Status: Full Family Communication: Pt at bedside Disposition Plan: Home when medically stable   IV Access:   Peripheral IV Procedures and diagnostic studies:   Dg Chest 2 View  12/25/2013   1. No acute cardiopulmonary abnormalities. 2. Tortuous aorta.    Medical Consultants:   GI Other Consultants:   None   Anti-Infectives:   None   Faye Ramsay, MD  [facility] Pager [phone]  If 7PM-7AM, please contact night-coverage [email or web address] Password [email or web address] 12/25/2013, 2:19 PM   LOS: 1 day   HPI/Subjective: No events overnight.   Objective: Filed Vitals:   12/24/13 2000 12/24/13 2055 12/25/13 0503 12/25/13 1342  BP: 119/65 153/72 128/71 136/74  Pulse: 95 79 87 86  Temp: 98.2 F (36.8 C) 98.2 F (36.8 C) 98.4 F (36.9 C) 98.3 F (36.8 C)  TempSrc: Oral Oral Oral Oral  Resp: 12  16 18 20   Height:  5\' 10"  (1.778 m)    Weight:  63.3 kg (139 lb 8.8 oz) 63.3 kg (139 lb 8.8 oz)   SpO2: 100% 100% 99% 98%    Intake/Output Summary (Last 24 hours) at 12/25/13 1419 Last data filed at 12/25/13 1300  Gross per 24 hour  Intake   1360 ml  Output      0 ml  Net   1360 ml    Exam:   General:  Pt is alert, follows commands appropriately, not in acute distress  Cardiovascular: Regular rate and rhythm, no rubs, no gallops  Respiratory: Clear to auscultation bilaterally, no wheezing, no crackles, no rhonchi  Abdomen: Soft, non tender, non distended, bowel sounds present, no guarding  Extremities: No edema, pulses DP and PT palpable bilaterally  Neuro: Grossly nonfocal  Data Reviewed: Basic Metabolic Panel:  Recent Labs Lab 12/24/13 1736 12/25/13 0454  NA 147 147  K 3.5* 3.1*  CL 113* 115*  CO2 23 23  GLUCOSE 106* 103*  BUN 32* 25*  CREATININE 0.53 0.56  CALCIUM 8.5 8.2*   Liver Function Tests:  Recent Labs Lab 12/24/13 1736 12/25/13 0454  AST 12 11  ALT 12 9  ALKPHOS 54 47  BILITOT 0.4 0.5  PROT 5.9* 5.3*  ALBUMIN 3.2* 2.9*   CBC:  Recent Labs Lab 12/24/13 1736 12/24/13 2215 12/25/13 0127 12/25/13 0454  WBC 10.1 9.3 9.7 8.9  NEUTROABS 7.4  --   --   --  HGB 10.5* 10.0* 10.1* 9.2*  HCT 32.1* 30.7* 29.4* 28.0*  MCV 93.6 95.0 96.7 96.2  PLT 294 271 229 237   CBG:  Recent Labs Lab 12/24/13 2113 12/24/13 2356 12/25/13 0501 12/25/13 0747 12/25/13 1212  GLUCAP 127* 117* 113* 93 92     Scheduled Meds: . [START ON 12/28/2013] pantoprazole (PROTONIX) IV  40 mg Intravenous Q12H  . sodium chloride  3 mL Intravenous Q12H   Continuous Infusions: . sodium chloride 125 mL/hr at 12/25/13 0808  . pantoprozole (PROTONIX) infusion 8 mg/hr (12/25/13 4132)

## 2013-12-25 NOTE — Op Note (Signed)
[facility] [place], [address]   ENDOSCOPY PROCEDURE REPORT  PATIENT: [name], [name]  MR#: [id number] BIRTHDATE: [birth year] , 78  yrs. old GENDER: Female ENDOSCOPIST:Verlie Liotta, MD REFERRED BY:  Dr. Shelia Media PROCEDURE DATE:  12/25/2013 PROCEDURE:      Upper endoscopy with biopsies ASA CLASS:     3 INDICATIONS:   Melena and anemia MEDICATION:    Fentanyl 25 mcg, 5 mg IV TOPICAL ANESTHETIC:    none  DESCRIPTION OF PROCEDURE:   the patient was brought from her hospital room to the endoscopy unit at [facility] long, having provided written consent after discussion of the nature, purpose, and risks of the procedure. the patient received the above sedation and remained stable throughout the procedure. After time out was performed, the Pentax adult video endoscope was passed under direct vision, entering the esophagus without difficulty. The larynx and vocal cords looked normal.  The esophagus was normal in its entirety except for a widely patent Schatzki's ring at the squamocolumnar junction, below which was a 2 cm hiatal hernia. No Mallory-Weiss tear, varices, reflux esophagitis, infection, or neoplasia were seen.  The stomach was entered. It contained no blood or coffee-ground material. In the antrum of the stomach was a solitary 5-6 mm fairly shallow clean-based ulcer without stigma of recent hemorrhage. The remainder of the stomach was entirely normal in appearance, without other evidence of aspirin-related gastropathy, nor diffuse gastritis, or any other ulcers, erosions, or polyps or masses. A retroflexed view of the cardia was normal.  The pylorus, duodenal bulb, and second duodenum were unremarkable in appearance.  Antral biopsies were obtained prior to removal of the scope, to look for evidence of Helicobacter pylori infection.     COMPLICATIONS: None  ENDOSCOPIC IMPRESSION:  1. No active bleeding or blood in the stomach at  the time this procedure 2. Antral ulcer, which is the presumed source of patient's recent bleeding, even though it was clean-based. It sounds as though the patient's bleeding occurred primarily about 48 hours ago, and the ulcer may have had a chance to improve slightly on antipeptic therapy overnight.  RECOMMENDATIONS: 1. Continue antipeptic therapy. I think we can convert to oral medication, advance the patient's diet, and discharge in the morning if stable 2. Await biopsy results, and treat Helicobacter pylori infection if present 3. Since the patient's aspirin is only for primary prophylaxis, I would recommend avoidance of aspirin and Mobic for the next 2 weeks. Tramadol or similar medication could be used to control arthritis pain in the interim. 4. Whether or not the patient should resume aspirin in the future, having had a possible adverse effect of that medication, it is something that she and her primary physician can discuss. If she is to resume aspirin, I would favor lifelong concurrent PPI prophylaxis 5. PPI therapy for one month to accomplish ulcer healing 6.I will arrange followup endoscopy with the patient for 2 months from now   _______________________________ eSigned:  Ronald Lobo, MD 12/25/2013 4:06 PM    PATIENT NAME:  Andrea Vasquez, Andrea Vasquez MR#: [id number]

## 2013-12-25 NOTE — Discharge Instructions (Signed)
Bloody Stools °Bloody stools means there is blood in your poop (stool). It is a sign that there is a problem somewhere in the digestive system. It is important for your doctor to find the cause of your bleeding, so the problem can be treated.  °HOME CARE °· Only take medicine as told by your doctor. °· Eat foods with fiber (prunes, bran cereals). °· Drink enough fluids to keep your pee (urine) clear or pale yellow. °· Sit in warm water (sitz bath) for 10 to 15 minutes as told by your doctor. °· Know how to take your medicines (enemas, suppositories) if advised by your doctor. °· Watch for signs that you are getting better or getting worse. °GET HELP RIGHT AWAY IF:  °· You are not getting better. °· You start to get better but then get worse again. °· You have new problems. °· You have severe bleeding from the place where poop comes out (rectum) that does not stop. °· You throw up (vomit) blood. °· You feel weak or pass out (faint). °· You have a fever. °MAKE SURE YOU:  °· Understand these instructions. °· Will watch your condition. °· Will get help right away if you are not doing well or get worse. °Document Released: 03/15/2009 Document Revised: 06/19/2011 Document Reviewed: 08/12/2010 °[employer]® Patient Information ©2015 [employer]. This information is not intended to replace advice given to you by your health care provider. Make sure you discuss any questions you have with your health care provider. ° °

## 2013-12-26 ENCOUNTER — Encounter (HOSPITAL_COMMUNITY): Payer: Self-pay | Admitting: Gastroenterology

## 2013-12-26 LAB — BASIC METABOLIC PANEL
Anion gap: 11 (ref 5–15)
BUN: 15 mg/dL (ref 6–23)
CO2: 22 meq/L (ref 19–32)
Calcium: 8.3 mg/dL — ABNORMAL LOW (ref 8.4–10.5)
Chloride: 112 mEq/L (ref 96–112)
Creatinine, Ser: 0.55 mg/dL (ref 0.50–1.10)
GFR calc Af Amer: >90 mL/min (ref >90)
GFR, EST NON AFRICAN AMERICAN: 89 mL/min — AB (ref >90)
GLUCOSE: 92 mg/dL (ref 70–99)
Potassium: 3.3 mEq/L — ABNORMAL LOW (ref 3.7–5.3)
SODIUM: 145 meq/L (ref 137–147)

## 2013-12-26 LAB — CBC
HCT: 24 % — ABNORMAL LOW (ref 36.0–46.0)
HEMOGLOBIN: 7.9 g/dL — AB (ref 12.0–15.0)
MCH: 31.2 pg (ref 26.0–34.0)
MCHC: 32.9 g/dL (ref 30.0–36.0)
MCV: 94.9 fL (ref 78.0–100.0)
Platelets: 220 10*3/uL (ref 150–400)
RBC: 2.53 MIL/uL — AB (ref 3.87–5.11)
RDW: 13.7 % (ref 11.5–15.5)
WBC: 8.1 10*3/uL (ref 4.0–10.5)

## 2013-12-26 MED ORDER — ACETAMINOPHEN 325 MG PO TABS: 650.0000 mg | ORAL_TABLET | Freq: Four times a day (QID) | ORAL | Status: AC | PRN

## 2013-12-26 MED ORDER — POTASSIUM CHLORIDE CRYS ER 20 MEQ PO TBCR
40.0000 meq | EXTENDED_RELEASE_TABLET | Freq: Once | ORAL | Status: AC
  Administered 2013-12-26: 40 meq via ORAL
  Filled 2013-12-26: qty 2

## 2013-12-26 NOTE — Progress Notes (Signed)
I think the patient is okay for discharge.  The patient feels well. No dizziness when getting up. One dark bowel movement this morning. BUN has decreased, suggesting the continued absence of active bleeding, although her hemoglobin has drifted down another gram to 7.9, which I think is most consistent with equilibration.  Biopsy results from yesterday's endoscopy are pending, but I will contact the patient with those results and arrange for Helicobacter pylori treatment if this organism is present.  I reviewed the recommendations that I put in yesterday's procedure note (please see) with the patient. She plans to see her primary physician early next week. She should probably have a hemoglobin and Hemoccult performed at that time.   She might try to go down to the [place] late next week, if she feels up to it, and I think it is safe for the patient to travel, and it will be more an issue of how strong she feels.  I specifically reviewed with the patient, the principles of nonsteroidal anti-inflammatory drugs and aspirin as they related to ulcer disease, and the need for ongoing PPI prophylaxis because of increased risk of recurrent ulceration and bleeding, if she is to remain on such medications going forward (something she can discuss the appropriateness of, with her primary physician).  I have made the patient an appointment to see me on November 9 at 2:45 PM, at which time we will arrange followup endoscopy to confirm ulcer healing.  Cleotis Nipper, M.D. [phone]

## 2013-12-26 NOTE — Discharge Summary (Signed)
Physician Discharge Summary  Uvaldo Bristle BPZ:[id number] DOB: October 15, 1935 DOA: 12/24/2013  PCP: Horatio Pel, MD  Admit date: 12/24/2013 Discharge date: 12/26/2013  Recommendations for Outpatient Follow-up:  1. Pt will need to follow up with PCP in 2-3 weeks post discharge 2. Please obtain BMP to evaluate electrolytes and kidney function 3. Please also check CBC to evaluate Hg and Hct levels  Discharge Diagnoses:  Active Problems:   GI bleed   Blood loss anemia   HLD (hyperlipidemia)   Hypertension   Discharge Condition: Stable  Diet recommendation: Heart healthy diet discussed in details   Brief narrative:  78 y.o. female with history of hypertension, hyperlipidemia and osteoarthritis presented to [facility] ED after having nausea and one episode of coffee - ground emesis, dark stools. This was associated with dizziness and generalized weakness. Pt has been taking Mobic, tramadol, aspirin for pain. TRH admitted for further evaluation.   Assessment and Plan:   Active Problems:  Coffee ground emesis, dark stools  - related to NSAID use along with aspirin  - all stopped  - EGD confirming  - continue Protonix for now  - see rec's below  Acute blood loss anemia  - Hg slightly down, no need for transfusion, pt wants to go home - stable for d/c from GI standpoint  Hypertension  - reasonable inpatient control  Hypokalemia  - supplemented   Code Status: Full  Family Communication: Pt at bedside  Disposition Plan: Home   IV Access:   Peripheral IV Procedures and diagnostic studies:   Dg Chest 2 View 12/25/2013 1. No acute cardiopulmonary abnormalities. 2. Tortuous aorta.  EGD 9/17 --> ENDOSCOPIC IMPRESSION:  1. No active bleeding or blood in the stomach at the time this procedure  2. Antral ulcer, which is the presumed source of patient's recent bleeding, even though it was clean-based. It sounds as though the patient's bleeding occurred primarily about 48 hours ago,  and the ulcer may have had a chance to improve slightly on antipeptic therapy overnight.  RECOMMENDATIONS:  1. Continue antipeptic therapy. I think we can convert to oral medication, advance the patient's diet. 2. Await biopsy results, and treat Helicobacter pylori infection if present  3. Since the patient's aspirin is only for primary prophylaxis, I would recommend avoidance of aspirin and Mobic for the next 2 weeks. Tramadol or similar medication could be used to control arthritis pain in the interim.  4. Whether or not the patient should resume aspirin in the future, having had a possible adverse effect of that medication, it is something that she and her primary physician can discuss. If she is to resume aspirin, I would favor lifelong concurrent PPI prophylaxis  5. PPI therapy for one month to accomplish ulcer healing   Medical Consultants:   GI Other Consultants:   None  Anti-Infectives:   None   Discharge Exam: Filed Vitals:   12/26/13 0408  BP: 109/66  Pulse: 76  Temp: 97.6 F (36.4 C)  Resp: 20   Filed Vitals:   12/25/13 1610 12/25/13 1620 12/25/13 2049 12/26/13 0408  BP: 143/91 156/65 106/60 109/66  Pulse: 80 74 75 76  Temp:   98.1 F (36.7 C) 97.6 F (36.4 C)  TempSrc:   Oral Oral  Resp: 17 15 16 20   Height:      Weight:      SpO2: 97% 97% 98% 97%    General: Pt is alert, follows commands appropriately, not in acute distress Cardiovascular: Regular rate and rhythm,  S1/S2 +, no murmurs, no rubs, no gallops Respiratory: Clear to auscultation bilaterally, no wheezing, no crackles, no rhonchi Abdominal: Soft, non tender, non distended, bowel sounds +, no guarding Extremities: no edema, no cyanosis, pulses palpable bilaterally DP and PT Neuro: Grossly nonfocal  Discharge Instructions  Discharge Instructions   Diet - low sodium heart healthy    Complete by:  As directed      Increase activity slowly    Complete by:  As directed             Medication List     STOP taking these medications       aspirin 81 MG chewable tablet     meloxicam 15 MG tablet  Commonly known as:  MOBIC     traMADol 50 MG tablet  Commonly known as:  ULTRAM      TAKE these medications       acetaminophen 325 MG tablet  Commonly known as:  TYLENOL  Take 2 tablets (650 mg total) by mouth every 6 (six) hours as needed for mild pain (or Fever >/= 101).     losartan 100 MG tablet  Commonly known as:  COZAAR  Take 100 mg by mouth every evening.     mirtazapine 15 MG tablet  Commonly known as:  REMERON  Take 15 mg by mouth at bedtime.     pantoprazole 40 MG tablet  Commonly known as:  PROTONIX  Take 1 tablet (40 mg total) by mouth 2 (two) times daily before a meal.     raloxifene 60 MG tablet  Commonly known as:  EVISTA  Take 60 mg by mouth daily.     rosuvastatin 5 MG tablet  Commonly known as:  CRESTOR  Take 5 mg by mouth every evening.           Follow-up Information   Schedule an appointment as soon as possible for a visit with Horatio Pel, MD.   Specialty:  Internal Medicine   Contact information:   [address] [place] [address] [phone]       Call Faye Ramsay, MD. (As needed call my cell phone [phone])    Specialty:  Internal Medicine   Contact information:   [address] [place] [address] [phone]        The results of significant diagnostics from this hospitalization (including imaging, microbiology, ancillary and laboratory) are listed below for reference.     Microbiology: No results found for this or any previous visit (from the past 240 hour(s)).   Labs: Basic Metabolic Panel:  Recent Labs Lab 12/24/13 1736 12/25/13 0454 12/26/13 0436  NA 147 147 145  K 3.5* 3.1* 3.3*  CL 113* 115* 112  CO2 23 23 22   GLUCOSE 106* 103* 92  BUN 32* 25* 15  CREATININE 0.53 0.56 0.55  CALCIUM 8.5 8.2* 8.3*   Liver Function Tests:  Recent Labs Lab  12/24/13 1736 12/25/13 0454  AST 12 11  ALT 12 9  ALKPHOS 54 47  BILITOT 0.4 0.5  PROT 5.9* 5.3*  ALBUMIN 3.2* 2.9*   No results found for this basename: LIPASE, AMYLASE,  in the last 168 hours No results found for this basename: AMMONIA,  in the last 168 hours CBC:  Recent Labs Lab 12/24/13 1736 12/24/13 2215 12/25/13 0127 12/25/13 0454 12/26/13 0436  WBC 10.1 9.3 9.7 8.9 8.1  NEUTROABS 7.4  --   --   --   --  HGB 10.5* 10.0* 10.1* 9.2* 7.9*  HCT 32.1* 30.7* 29.4* 28.0* 24.0*  MCV 93.6 95.0 96.7 96.2 94.9  PLT 294 271 229 237 220   Cardiac Enzymes: No results found for this basename: CKTOTAL, CKMB, CKMBINDEX, TROPONINI,  in the last 168 hours BNP: BNP (last 3 results) No results found for this basename: PROBNP,  in the last 8760 hours CBG:  Recent Labs Lab 12/24/13 2113 12/24/13 2356 12/25/13 0501 12/25/13 0747 12/25/13 1212  GLUCAP 127* 117* 113* 93 92     SIGNED: Time coordinating discharge: Over 30 minutes  Faye Ramsay, MD  [facility] 12/26/2013, 11:08 AM Pager [phone]  If 7PM-7AM, please contact night-coverage [email or web address] Password [email or web address]

## 2014-02-24 ENCOUNTER — Other Ambulatory Visit: Payer: Self-pay | Admitting: Gastroenterology

## 2014-05-21 ENCOUNTER — Other Ambulatory Visit: Payer: Self-pay | Admitting: Dermatology

## 2014-06-04 ENCOUNTER — Other Ambulatory Visit: Payer: Self-pay | Admitting: Dermatology

## 2014-09-14 ENCOUNTER — Other Ambulatory Visit: Payer: Self-pay | Admitting: Endocrinology

## 2014-09-14 DIAGNOSIS — E049 Nontoxic goiter, unspecified: Secondary | ICD-10-CM

## 2015-01-31 IMAGING — CR DG CHEST 2V
2 series · 2 of 2 positions shown · non-contrast
Comparison: [DATE]

CLINICAL DATA: Cough and sweats.

EXAM:
CHEST  2 VIEW

[w chest pa]
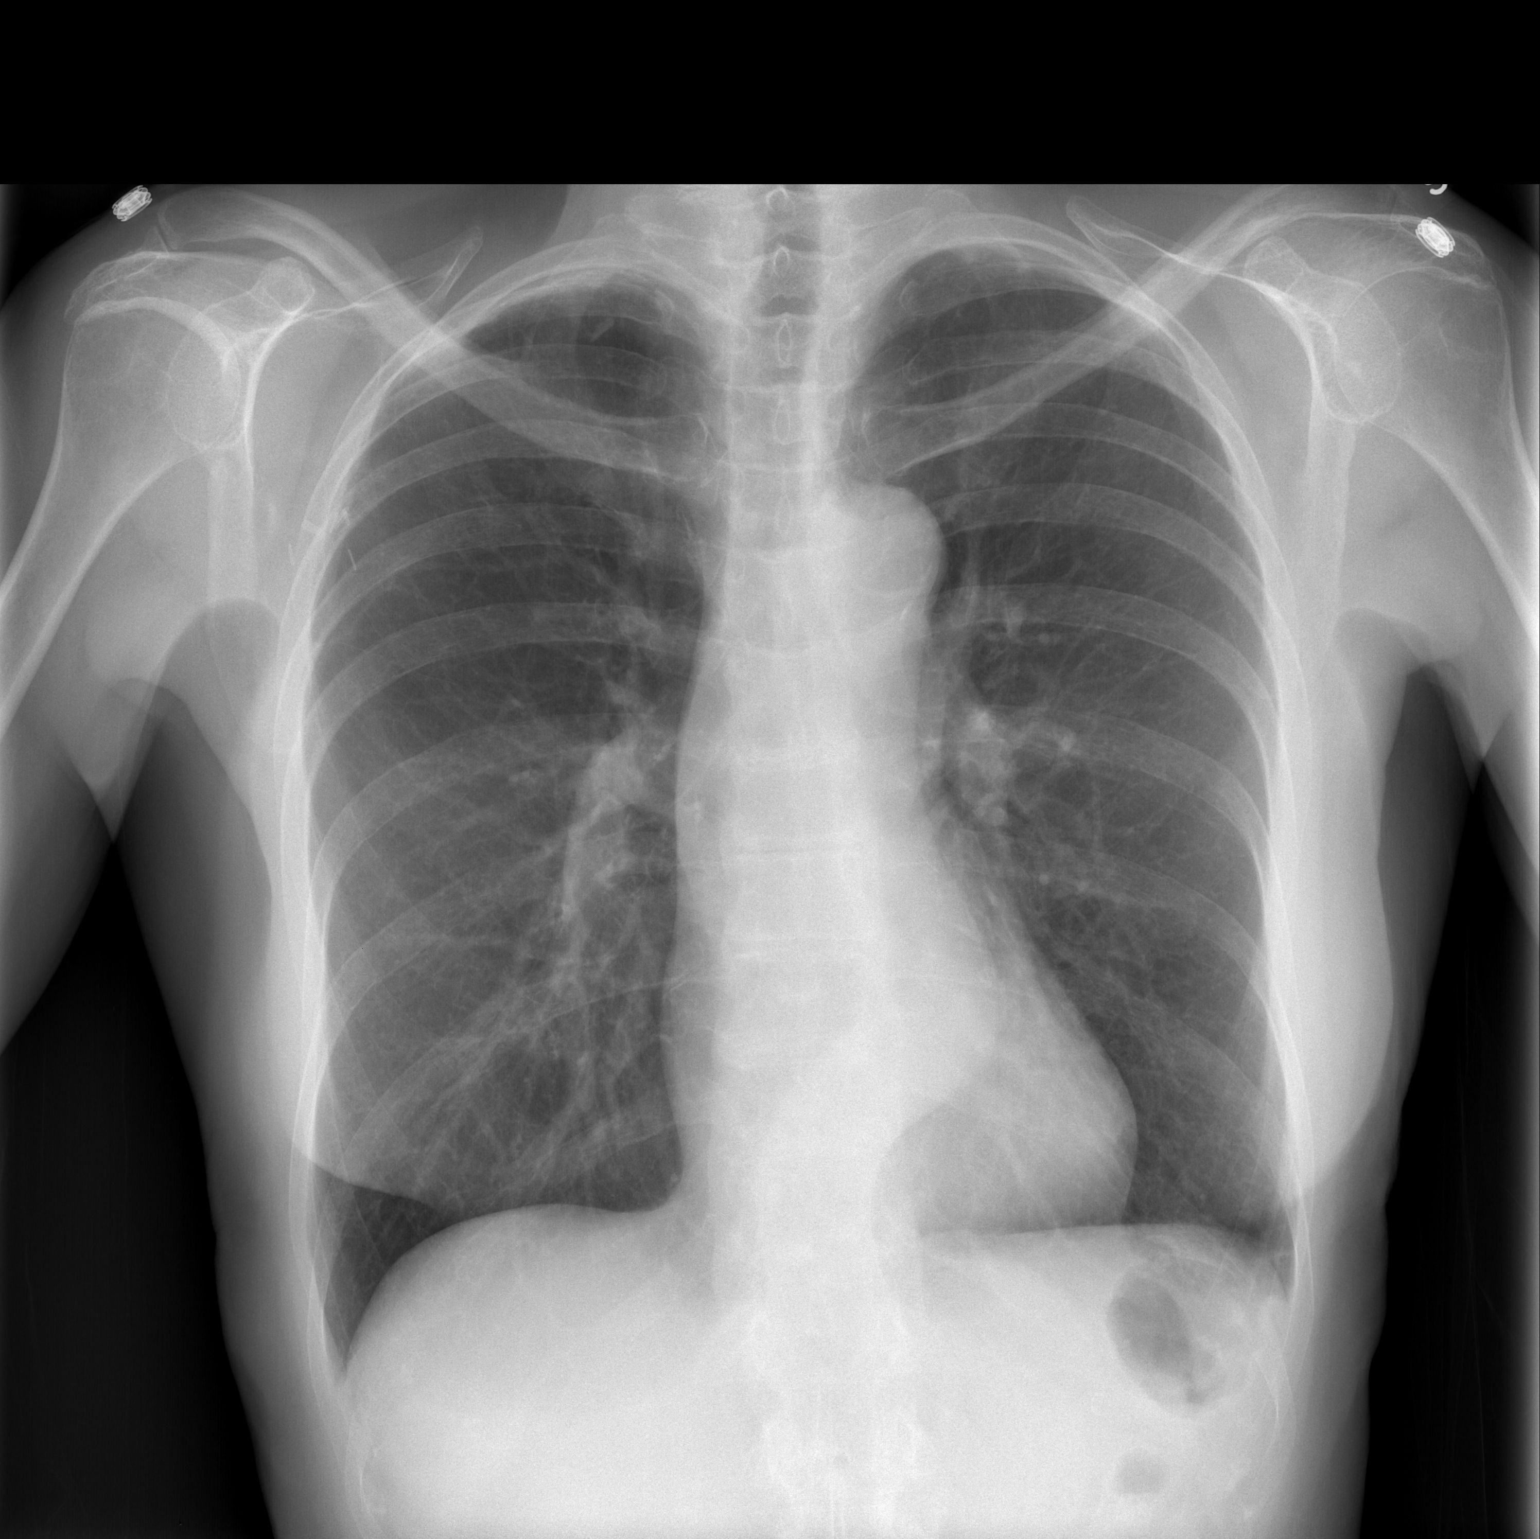

[w chest lat]
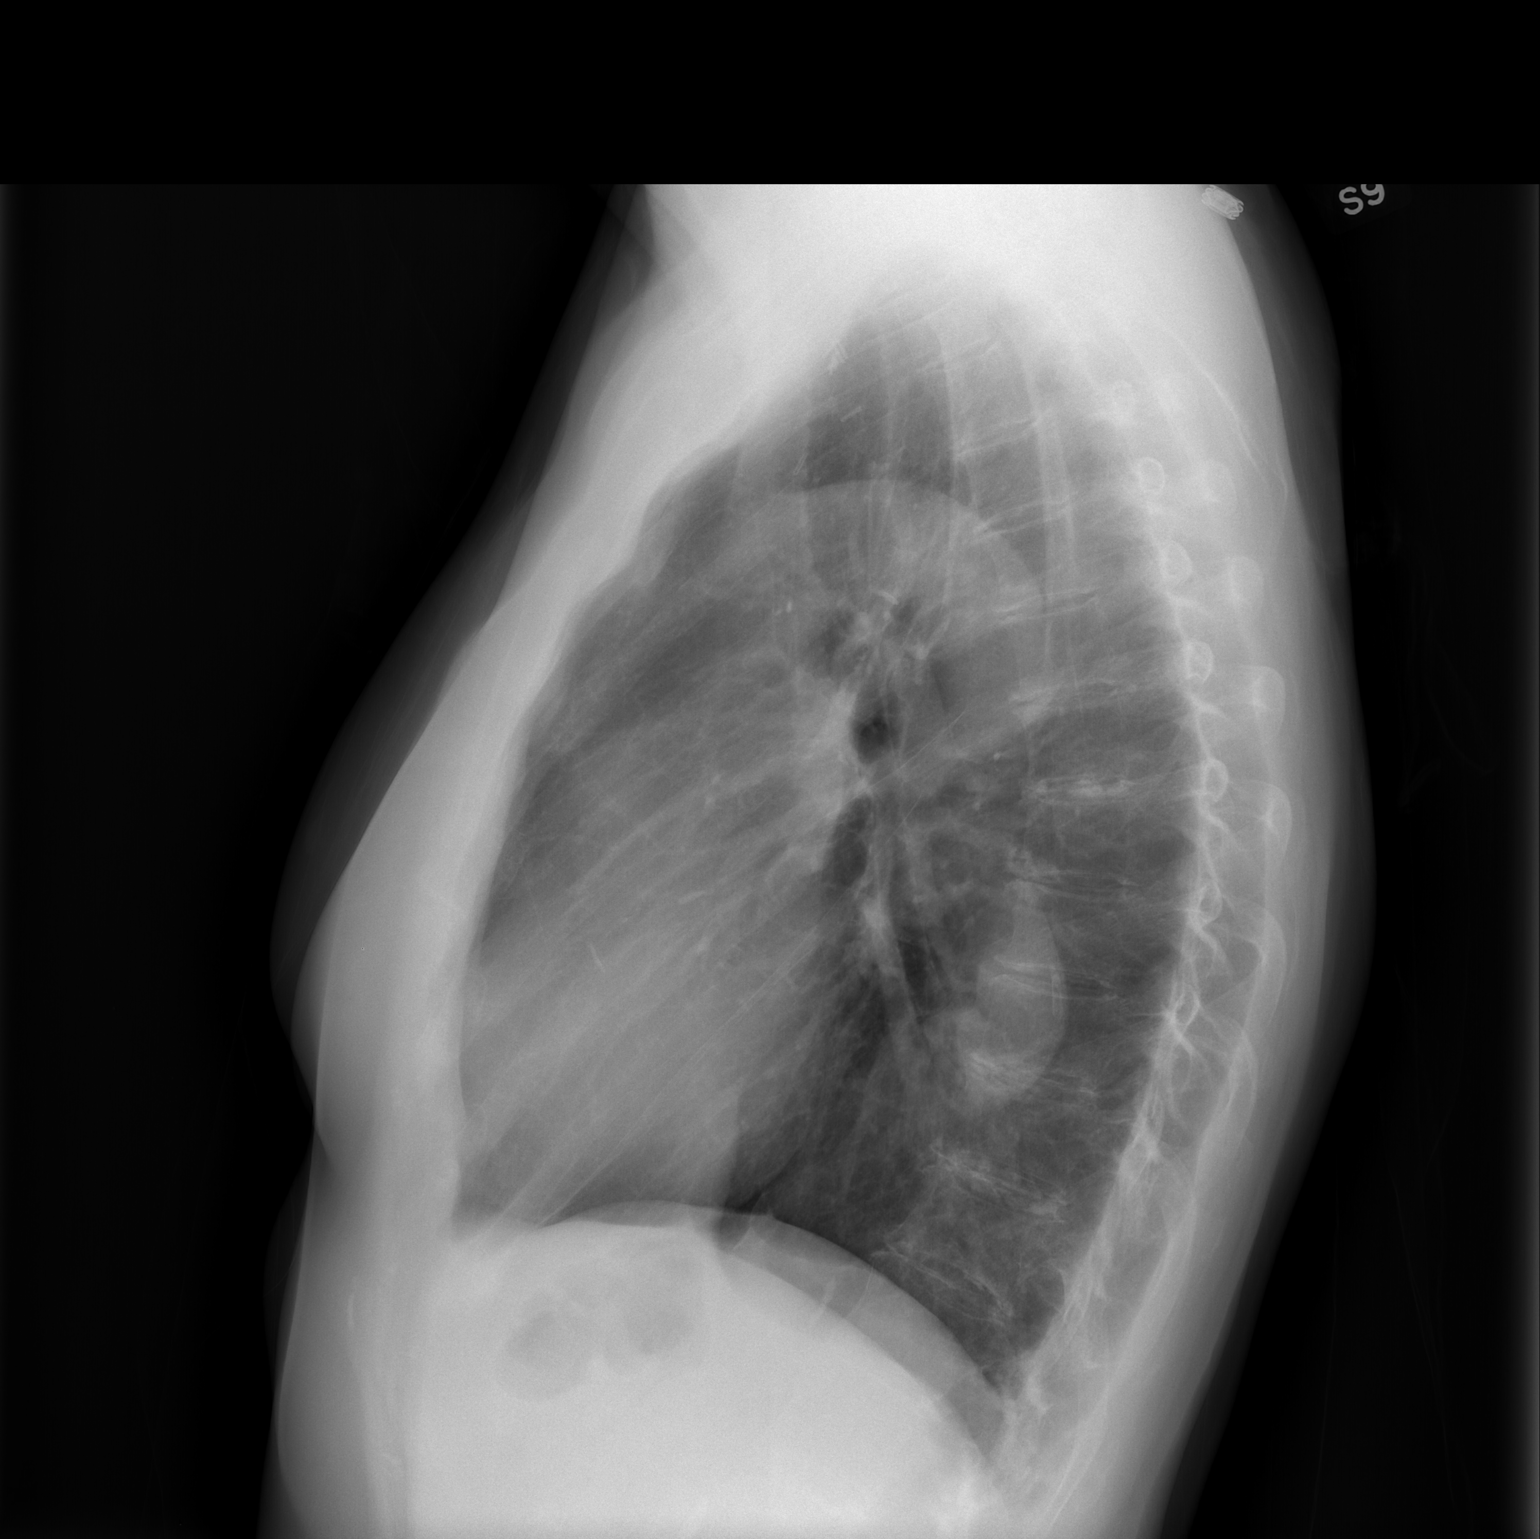

[2 of 2 positions shown; findings below may reference images not displayed]

FINDINGS: The heart size and mediastinal contours are within normal limits.
Tortuous thoracic aorta. Chronic interstitial coarsening identified
bilaterally. Both lungs are clear. The visualized skeletal
structures are unremarkable.
IMPRESSION: 1. No acute cardiopulmonary abnormalities.
2. Tortuous aorta.

## 2015-07-15 ENCOUNTER — Other Ambulatory Visit: Payer: Self-pay | Admitting: Gastroenterology

## 2015-09-09 ENCOUNTER — Ambulatory Visit
Admission: RE | Admit: 2015-09-09 | Discharge: 2015-09-09 | Disposition: A | Discharge status: Home / Self Care | Payer: Medicare Other | Source: Ambulatory Visit | Attending: Endocrinology | Admitting: Endocrinology

## 2015-09-09 DIAGNOSIS — E049 Nontoxic goiter, unspecified: Secondary | ICD-10-CM

## 2017-10-24 ENCOUNTER — Other Ambulatory Visit: Payer: Self-pay | Admitting: Endocrinology

## 2017-10-24 DIAGNOSIS — E049 Nontoxic goiter, unspecified: Secondary | ICD-10-CM

## 2017-11-13 ENCOUNTER — Ambulatory Visit
Admission: RE | Admit: 2017-11-13 | Discharge: 2017-11-13 | Disposition: A | Discharge status: Home / Self Care | Payer: Medicare Other | Source: Ambulatory Visit | Attending: Endocrinology | Admitting: Endocrinology

## 2017-11-13 DIAGNOSIS — E049 Nontoxic goiter, unspecified: Secondary | ICD-10-CM

## 2018-03-25 ENCOUNTER — Ambulatory Visit: Payer: Medicare Other | Admitting: Neurology

## 2018-05-20 ENCOUNTER — Encounter: Payer: Self-pay | Admitting: Neurology

## 2018-05-20 ENCOUNTER — Ambulatory Visit: Payer: Medicare Other | Admitting: Neurology

## 2018-05-20 VITALS — BP 132/84 | HR 88 | Ht 69.0 in | Wt 171.0 lb

## 2018-05-20 DIAGNOSIS — R404 Transient alteration of awareness: Secondary | ICD-10-CM | POA: Diagnosis not present

## 2018-05-20 DIAGNOSIS — G43009 Migraine without aura, not intractable, without status migrainosus: Secondary | ICD-10-CM | POA: Diagnosis not present

## 2018-05-20 DIAGNOSIS — H81399 Other peripheral vertigo, unspecified ear: Secondary | ICD-10-CM | POA: Diagnosis not present

## 2018-05-20 DIAGNOSIS — H539 Unspecified visual disturbance: Secondary | ICD-10-CM | POA: Diagnosis not present

## 2018-05-20 MED ORDER — DIVALPROEX SODIUM ER 250 MG PO TB24: 500.0000 mg | ORAL_TABLET | Freq: Every day | ORAL | Status: DC

## 2018-05-20 NOTE — Progress Notes (Signed)
[facility] [address] [place]. [place] [address] [phone]       OFFICE CONSULT NOTE  Ms. JANEAN EISCHEN Date of Birth:  11/14/1935 Medical Record Number:  [id number]   Referring MD: Deland Pretty  Reason for Referral: Near fainting spells  HPI: Ms. Kathe Mariner is a pleasant 83 year old Caucasian lady seen today for initial office consultation visit.  History is obtained from the patient, review of referral notes and I personally reviewed electronic medical records.  No relevant imaging films are available in PACS for review.  Ms. Kathe Mariner is a pleasant 83 year old lady with past medical history of hypertension, hyperlipidemia, insomnia, rheumatoid arthritis and osteoarthritis.  She states she has had multiple stereotypical recurrent episodes over the last 10 years.  These occur at a frequency of 2 to 3/year but are sporadic and without clear triggers or precipitating events.  She states the episodes are quite similar.  She feels that the brain is fuzzy and she has trouble thinking.  Her vision also is distorted and she cannot see clearly.  These episodes usually last less than a minute though few of them have lasted several minutes.  The first episode occurred 2 years ago while she was driving but she was able to pull over and waited for the episode to pass.  She also had an episode in November last year which lasted longer with she was able to sit down and let it pass.  She states she is quite aware of her surroundings and does not lose consciousness.  If she sits down and forces herself she can make the episode go away.  She denies any headache accompanying during or after the episode.  She denies any palpitations, sweating, chest pain or discomfort.  She recently wore a Holter monitor for 2 weeks but is not aware of the results.  She has not had any brain imaging studies done no EEG done.  She has remote history of migraine headaches but states she outgrew them during her  menopause.  These headaches was severe and disabling and were accompanied by vision disturbance as well as some altered awareness.  She does not take aspirin because of history of GI bleed.  She has no prior history of stroke, TIA, seizures, significant head injury with loss of consciousness.  ROS:   14 system review of systems is positive for near fainting, altered awareness, blurred vision and all other systems negative  PMH:  Past Medical History:  Diagnosis Date  . Arthritis   . Cancer [facility]) 1992   right breast  . Depression    mild  . Dysrhythmia    "heart fluttering-been checked out, nothing wrong"  . Goiter    small on right side, monitored  . Hypercholesteremia    controlled by medicine  . Hypertension     Social History:  Social History   Socioeconomic History  . Marital status: Divorced    Spouse name: Not on file  . Number of children: Not on file  . Years of education: Not on file  . Highest education level: Not on file  Occupational History  . Not on file  Social Needs  . Financial resource strain: Not on file  . Food insecurity:    Worry: Not on file    Inability: Not on file  . Transportation needs:    Medical: Not on file    Non-medical: Not on file  Tobacco Use  . Smoking status: Former Smoker    Packs/day: 0.25  Years: 10.00    Pack years: 2.50    Types: Cigarettes    Last attempt to quit: 04/10/1968    Years since quitting: 50.1  . Smokeless tobacco: Never Used  Substance and Sexual Activity  . Alcohol use: Yes    Comment: glass of wine occasional  . Drug use: No  . Sexual activity: Not on file  Lifestyle  . Physical activity:    Days per week: Not on file    Minutes per session: Not on file  . Stress: Not on file  Relationships  . Social connections:    Talks on phone: Not on file    Gets together: Not on file    Attends religious service: Not on file    Active member of club or organization: Not on file    Attends meetings of  clubs or organizations: Not on file    Relationship status: Not on file  . Intimate partner violence:    Fear of current or ex partner: Not on file    Emotionally abused: Not on file    Physically abused: Not on file    Forced sexual activity: Not on file  Other Topics Concern  . Not on file  Social History Narrative  . Not on file    Medications:   Current Outpatient Medications on File Prior to Visit  Medication Sig Dispense Refill  . acetaminophen (TYLENOL) 325 MG tablet Take 2 tablets (650 mg total) by mouth every 6 (six) hours as needed for mild pain (or Fever >/= 101). 65 tablet 0  . mirtazapine (REMERON) 15 MG tablet Take 15 mg by mouth at bedtime.    . Multiple Vitamins-Minerals (PRESERVISION AREDS PO) Take by mouth.    . Omega-3 Fatty Acids (OMEGA 3 PO) Take by mouth.    . rosuvastatin (CRESTOR) 5 MG tablet Take 5 mg by mouth every evening.     No current facility-administered medications on file prior to visit.     Allergies:  No Known Allergies  Physical Exam General: well developed, well nourished, seated, in no evident distress Head: head normocephalic and atraumatic.   Neck: supple with no carotid or supraclavicular bruits Cardiovascular: regular rate and rhythm, no murmurs Musculoskeletal: no deformity except mild kyphoscoliosis Skin:  no rash/petichiae Vascular:  Normal pulses all extremities  Neurologic Exam Mental Status: Awake and fully alert. Oriented to place and time. Recent and remote memory intact. Attention span, concentration and fund of knowledge appropriate. Mood and affect appropriate.  Cranial Nerves: Fundoscopic exam reveals sharp disc margins. Pupils equal, briskly reactive to light. Extraocular movements full without nystagmus. Visual fields full to confrontation. Hearing mildly diminished bilaterally. Facial sensation intact. Face, tongue, palate moves normally and symmetrically.  Motor: Normal bulk and tone. Normal strength in all tested  extremity muscles. Sensory.: intact to touch , pinprick , position and vibratory sensation.  Coordination: Rapid alternating movements normal in all extremities. Finger-to-nose and heel-to-shin performed accurately bilaterally. Gait and Station: Arises from chair without difficulty. Stance is normal. Gait demonstrates normal stride length and balance . Able to heel, toe and tandem walk with moderate difficulty.  Reflexes: 1+ and symmetric. Toes downgoing.   NIHSS  0 Modified Rankin  0  ASSESSMENT: 83 year old Caucasian lady with recurrent stereotypical episodes of altered thinking and vision dysfunction of unclear etiology.  Possibilities include atypical migraine versus vertebrobasilar TIAs or complex partial seizures.   PLAN: I had a long discussion with the patient regarding her recurrent stereotypical episodes of transient  altered thinking as well as vision disturbance being of unclear etiology.  Possibilities include a typical migraine, seizures vertebrobasilar TIAs is less likely.  I recommend a trial of Depakote ER 500 mg daily.  I have discussed possible risk factors side effects and advised her to call me if needed.  Check EEG, MRI scan of the brain, and MRA of the brain and neck.  We will follow the results of the 2-week heart monitor that she recently completed.  If all above tests are unyielding may consider loop recorder insertion for long-term monitoring for arrhythmias.  Greater than 50% time during this 45-minute consultation visit was spent on counseling and coordination of care about her episodes of transient altered mentation and vision dysfunction and answering questions she will return for follow-up in the future in 3 months or call earlier if necessary Antony Contras, [place] Pager: [phone] 05/20/2018 5:08 PM Note: This document was prepared with digital dictation and possible smart phrase technology. Any transcriptional errors that  result from this process are unintentional.

## 2018-05-20 NOTE — Patient Instructions (Signed)
I had a long discussion with the patient regarding her recurrent stereotypical episodes of transient altered thinking as well as vision disturbance being of unclear etiology.  Possibilities include a typical migraine, seizures vertebrobasilar TIAs is less likely.  I recommend a trial of Depakote ER 500 mg daily.  I have discussed possible risk factors side effects and advised her to call me if needed.  Check EEG, MRI scan of the brain, and MRA of the brain and neck.  We will follow the results of the 2-week heart monitor that she recently completed.  If all above tests are unyielding may consider loop recorder insertion for long-term monitoring for arrhythmias.

## 2018-05-21 ENCOUNTER — Telehealth: Payer: Self-pay | Admitting: Neurology

## 2018-05-21 NOTE — Telephone Encounter (Signed)
Spoke to the patient she is aware.  °

## 2018-05-21 NOTE — Telephone Encounter (Signed)
[employer] medicare order sent to GI. No auth they will reach out to the pt to schedule.  °

## 2018-06-19 ENCOUNTER — Ambulatory Visit
Admission: RE | Admit: 2018-06-19 | Discharge: 2018-06-19 | Disposition: A | Discharge status: Home / Self Care | Payer: Medicare Other | Source: Ambulatory Visit | Attending: Neurology | Admitting: Neurology

## 2018-06-19 ENCOUNTER — Other Ambulatory Visit: Payer: Self-pay

## 2018-06-19 DIAGNOSIS — H81399 Other peripheral vertigo, unspecified ear: Secondary | ICD-10-CM

## 2018-06-19 MED ORDER — GADOBENATE DIMEGLUMINE 529 MG/ML IV SOLN
15.0000 mL | Freq: Once | INTRAVENOUS | Status: AC | PRN
  Administered 2018-06-19: 15 mL via INTRAVENOUS

## 2018-06-24 ENCOUNTER — Telehealth: Payer: Self-pay

## 2018-06-24 NOTE — Telephone Encounter (Signed)
I called patient about her MRA neck. I stated it shows no major blockages of the large vessels in the neck.Pt verbalized understanding.  I called pt that the MRI of brain was unremarkable. The pt verbalized understanding.  --I called patient about MRI head it shows not significant narrowing of the large blood vessels in the brain. The pt verbalized understanding. ---------- ------

## 2018-06-24 NOTE — Telephone Encounter (Signed)
-----   Message from Garvin Fila, MD sent at 06/20/2018  5:25 PM EDT ----- Andrea Vasquez inform the patient that MRI study of the neck shows no major blockages of the large blood vessels in the neck

## 2018-06-26 ENCOUNTER — Other Ambulatory Visit: Payer: Self-pay

## 2018-06-26 ENCOUNTER — Ambulatory Visit: Payer: Medicare Other

## 2018-06-26 DIAGNOSIS — R404 Transient alteration of awareness: Secondary | ICD-10-CM

## 2018-06-26 DIAGNOSIS — R4182 Altered mental status, unspecified: Secondary | ICD-10-CM

## 2018-06-26 DIAGNOSIS — H539 Unspecified visual disturbance: Secondary | ICD-10-CM

## 2018-07-03 ENCOUNTER — Telehealth: Payer: Self-pay | Admitting: *Deleted

## 2018-07-03 NOTE — Telephone Encounter (Signed)
-----   Message from Garvin Fila, MD sent at 06/27/2018  3:27 PM EDT ----- Andrea Vasquez inform the patient had EEG study was normal

## 2018-07-03 NOTE — Telephone Encounter (Signed)
Called pt & advised her that her EEG was normal. She was very appreciative. I advised that for now we plan to see her on 08/12/2018 @ 10:30 however with the situation [phone]), we will call her if there are any changes. She verbalized appreciation and understanding.

## 2018-08-06 ENCOUNTER — Telehealth: Payer: Self-pay

## 2018-08-06 NOTE — Telephone Encounter (Signed)
I call pt that her visit will be change to video visit due to [place].  I receive verbal consent to do video and file insurance. Med and PCP updated. Pt given [email or web address] to download. She was told to access her join meeting from her cell phone for visit. PT has a android phone. I explain that she will have the webex as app that will stay on her phone and to not join meeting that way. Pt verbalized understanding and knows to download app and join meeting via email.

## 2018-08-12 ENCOUNTER — Ambulatory Visit (INDEPENDENT_AMBULATORY_CARE_PROVIDER_SITE_OTHER): Payer: Medicare Other | Admitting: Neurology

## 2018-08-12 ENCOUNTER — Other Ambulatory Visit: Payer: Self-pay

## 2018-08-12 DIAGNOSIS — R6889 Other general symptoms and signs: Secondary | ICD-10-CM | POA: Diagnosis not present

## 2018-08-12 NOTE — Progress Notes (Signed)
Virtual Visit via Video Note  I connected with [place] on 08/12/18 at 10:30 AM EDT by a video enabled telemedicine application and verified that I am speaking with the correct person using two identifiers.  This meeting was performed using [employer] app.  Location: Patient: at her home Provider : at  [facility] office    I discussed the limitations of evaluation and management by telemedicine and the availability of in person appointments. The patient expressed understanding and agreed to proceed.  History of Present Illness: Patient is seen for follow-up after last office consultation visit on 05/20/2018.  Patient states she is done well since that visit and has not had no further episodes of decrease responsiveness of vision dysfunction.  She did not start taking Depakote which had prescribed and she read the side effects and was concerned.  She has done well and has had no new health problems.  She has had no visits to primary care physician or to the ER.  Her medication list appears unchanged.  She is unable to take aspirin because of history of GI bleed.  She underwent MRI scan of the brain on 06/19/2018 which have personally reviewed and was unremarkable without significant abnormalities.  MRA of the brain and neck both did not show significant large vessel stenosis in the neck or the brain vessels.  EEG done on 06/26/2018 was normal without evidence of any electrical irritability in the brain.  She is doing well and has no complaints today.   Observations/Objective: Neurological exam is limited due to constraints of video exam.  She is pleasant awake alert cooperative.  Speech and language appear normal.  Extraocular movements are full range without nystagmus.  Face is symmetric without weakness.  Tongue midline.  Motor system exam shows symmetric upper and lower extremity strength.  Finger-to-nose coordination is accurate bilaterally.  Gait not tested.  Assessment and Plan: 83 year old  lady with recurrent transient stereotypical episodes of altered thinking and vision dysfunction of unclear etiology.  Possibilities include atypical migraine likely.  Vertebrobasilar TIAs or complex partial seizures less likely.  Follow Up Instructions: I had a long discussion with the patient regarding her recurrent stereotypical episodes of transient altered thinking with vision disturbance being of unclear etiology.  Since episodes are highly infrequent I do not believe trial of migraine prevention medications is necessary any longer.  She was advised to call if she had recurrent episodes with increasing frequency to reconsider trial of medication like Depakote or Topamax in the future.  I do not believe further neurological testing or even scheduled follow-up appointment is necessary unless she has recurrent or new symptoms.  She voiced understanding with the plan.   I discussed the assessment and treatment plan with the patient. The patient was provided an opportunity to ask questions and all were answered. The patient agreed with the plan and demonstrated an understanding of the instructions.   The patient was advised to call back or seek an in-person evaluation if the symptoms worsen or if the condition fails to improve as anticipated.  I provided 25 minutes of non-face-to-face time during this encounter.   Antony Contras, MD

## 2018-08-29 ENCOUNTER — Ambulatory Visit: Payer: Medicare Other | Admitting: Neurology

## 2019-04-30 ENCOUNTER — Ambulatory Visit: Payer: Medicare Other

## 2019-05-02 ENCOUNTER — Ambulatory Visit: Payer: Medicare PPO | Attending: Internal Medicine

## 2019-05-02 DIAGNOSIS — Z23 Encounter for immunization: Secondary | ICD-10-CM | POA: Insufficient documentation

## 2019-05-02 NOTE — Progress Notes (Signed)
   Covid-19 Vaccination Clinic  Name:  VARNELL KEAVENEY    MRN: [id number] DOB: Jan 21, 1936  05/02/2019  Ms. Avants was observed post Covid-19 immunization for 15 minutes without incidence. She was provided with Vaccine Information Sheet and instruction to access the V-Safe system.   Ms. Zell was instructed to call 911 with any severe reactions post vaccine: Marland Kitchen Difficulty breathing  . Swelling of your face and throat  . A fast heartbeat  . A bad rash all over your body  . Dizziness and weakness    Immunizations Administered    Name Date Dose VIS Date Route   Pfizer COVID-19 Vaccine 05/02/2019 10:25 AM 0.3 mL 03/21/2019 Intramuscular   Manufacturer: [place]   Lot: [id number]   [place]: [id number]

## 2019-05-23 ENCOUNTER — Ambulatory Visit: Payer: Medicare PPO | Attending: Internal Medicine

## 2019-05-23 DIAGNOSIS — Z23 Encounter for immunization: Secondary | ICD-10-CM | POA: Insufficient documentation

## 2019-05-23 NOTE — Progress Notes (Signed)
   Covid-19 Vaccination Clinic  Name:  Andrea Vasquez    MRN: [id number] DOB: November 17, 1935  05/23/2019  [name] was observed post Covid-19 immunization for 15 minutes without incidence. She was provided with Vaccine Information Sheet and instruction to access the V-Safe system.   [name] was instructed to call 911 with any severe reactions post vaccine: Marland Kitchen Difficulty breathing  . Swelling of your face and throat  . A fast heartbeat  . A bad rash all over your body  . Dizziness and weakness    Immunizations Administered    Name Date Dose VIS Date Route   Pfizer COVID-19 Vaccine 05/23/2019 12:45 PM 0.3 mL 03/21/2019 Intramuscular   Manufacturer: [place]   Lot: [id number]   [place]: [id number]

## 2019-06-11 DIAGNOSIS — H2513 Age-related nuclear cataract, bilateral: Secondary | ICD-10-CM | POA: Diagnosis not present

## 2019-06-11 DIAGNOSIS — H353131 Nonexudative age-related macular degeneration, bilateral, early dry stage: Secondary | ICD-10-CM | POA: Diagnosis not present

## 2019-07-22 DIAGNOSIS — H25043 Posterior subcapsular polar age-related cataract, bilateral: Secondary | ICD-10-CM | POA: Diagnosis not present

## 2019-07-22 DIAGNOSIS — H2513 Age-related nuclear cataract, bilateral: Secondary | ICD-10-CM | POA: Diagnosis not present

## 2019-07-22 DIAGNOSIS — H2512 Age-related nuclear cataract, left eye: Secondary | ICD-10-CM | POA: Diagnosis not present

## 2019-07-22 DIAGNOSIS — H353131 Nonexudative age-related macular degeneration, bilateral, early dry stage: Secondary | ICD-10-CM | POA: Diagnosis not present

## 2019-07-22 DIAGNOSIS — H18413 Arcus senilis, bilateral: Secondary | ICD-10-CM | POA: Diagnosis not present

## 2019-07-22 DIAGNOSIS — H25013 Cortical age-related cataract, bilateral: Secondary | ICD-10-CM | POA: Diagnosis not present

## 2019-08-29 DIAGNOSIS — H2511 Age-related nuclear cataract, right eye: Secondary | ICD-10-CM | POA: Diagnosis not present

## 2019-08-29 DIAGNOSIS — H25041 Posterior subcapsular polar age-related cataract, right eye: Secondary | ICD-10-CM | POA: Diagnosis not present

## 2019-08-29 DIAGNOSIS — H25011 Cortical age-related cataract, right eye: Secondary | ICD-10-CM | POA: Diagnosis not present

## 2019-08-29 DIAGNOSIS — H2512 Age-related nuclear cataract, left eye: Secondary | ICD-10-CM | POA: Diagnosis not present

## 2019-09-25 DIAGNOSIS — H2511 Age-related nuclear cataract, right eye: Secondary | ICD-10-CM | POA: Diagnosis not present

## 2019-09-26 DIAGNOSIS — H2511 Age-related nuclear cataract, right eye: Secondary | ICD-10-CM | POA: Diagnosis not present

## 2019-12-26 IMAGING — US US THYROID
1 series · 13 of 25 positions shown · non-contrast
Comparison: 09/09/2015

CLINICAL DATA: Follow-up thyroid goiter and nodules. Dominant right
thyroid nodule was biopsied on 07/06/2009.

EXAM:
THYROID ULTRASOUND
TECHNIQUE: Ultrasound examination of the thyroid gland and adjacent soft
tissues was performed.

[Series 1: us thyroid · 0.07mm/px · 13 of 69 slices shown]
[im 1/69]
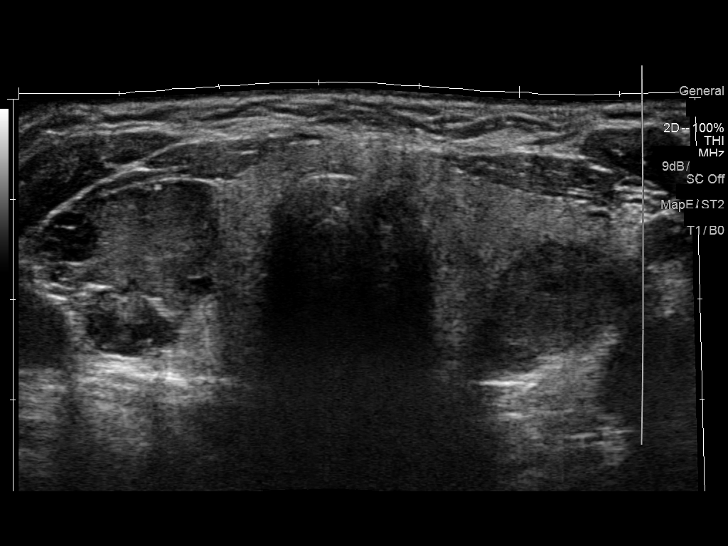
[im 6/69]
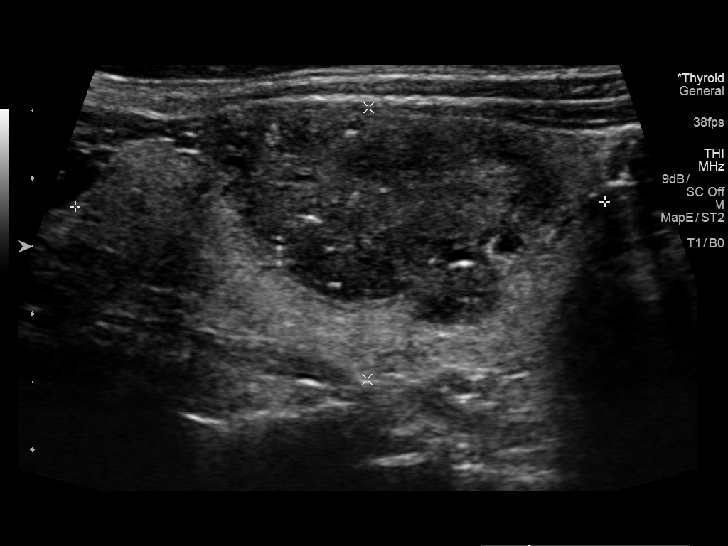
[im 12/69]
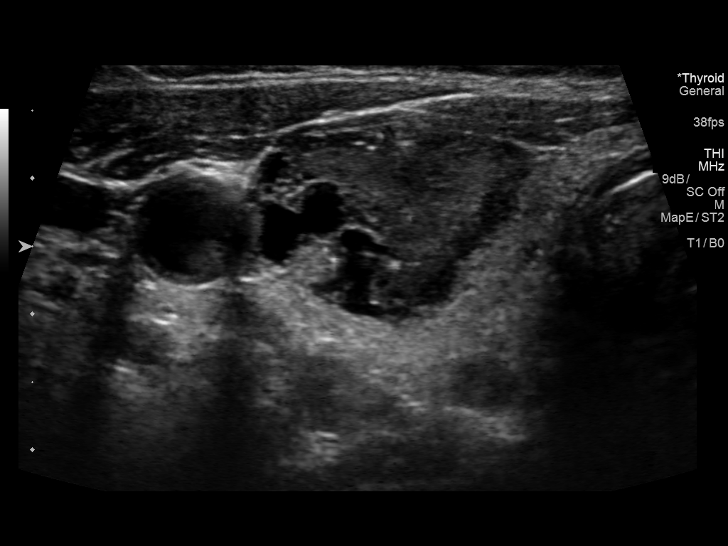
[im 18/69]
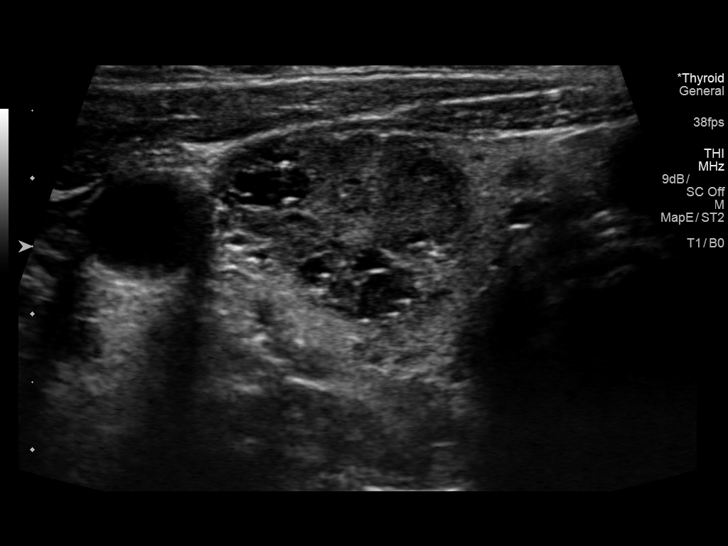
[im 23/69]
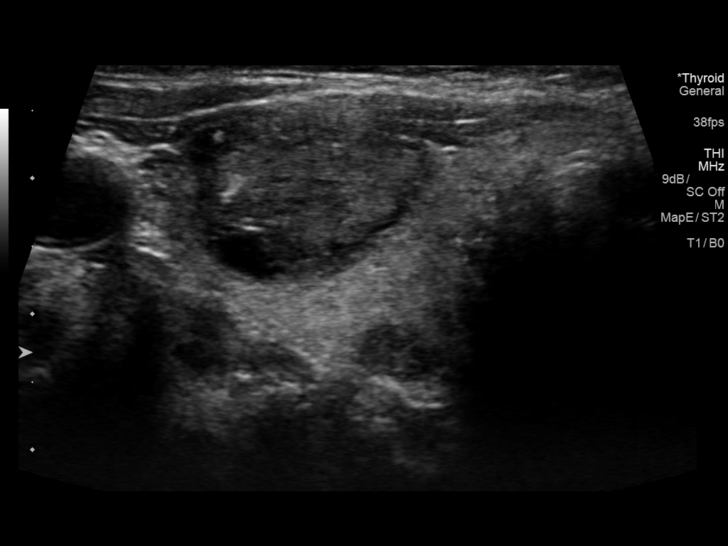
[im 29/69]
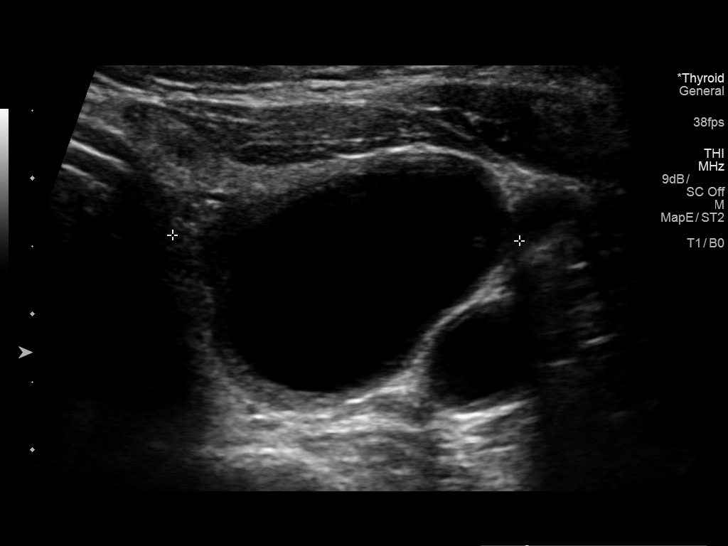
[im 35/69]
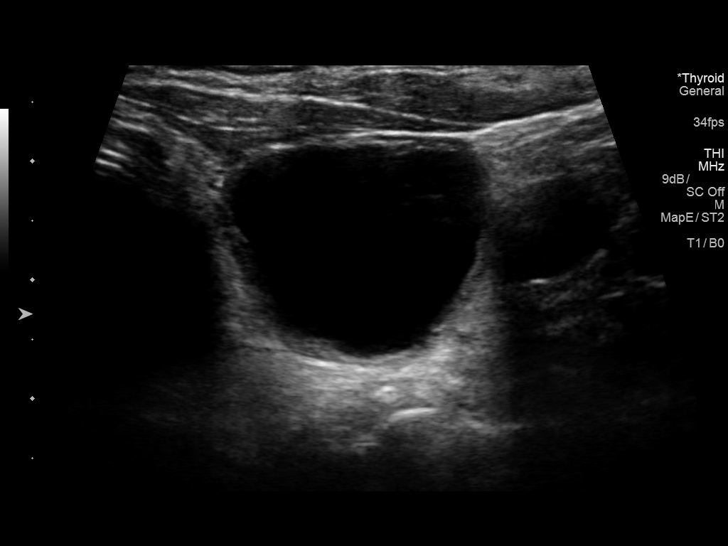
[im 40/69]
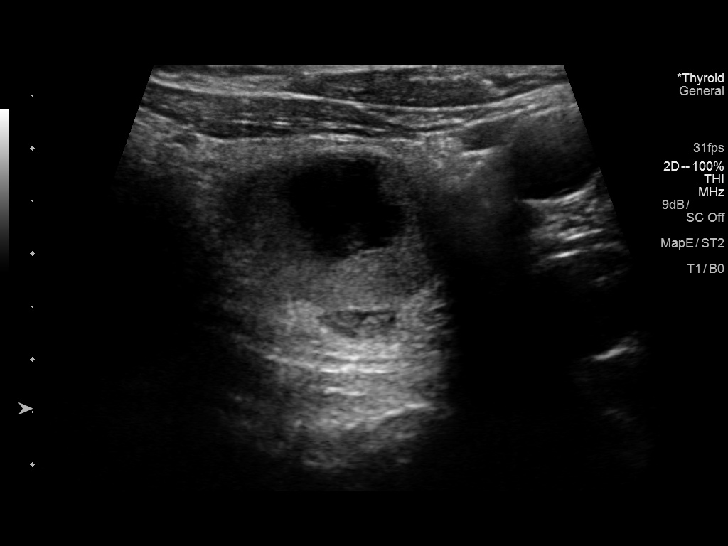
[im 46/69]
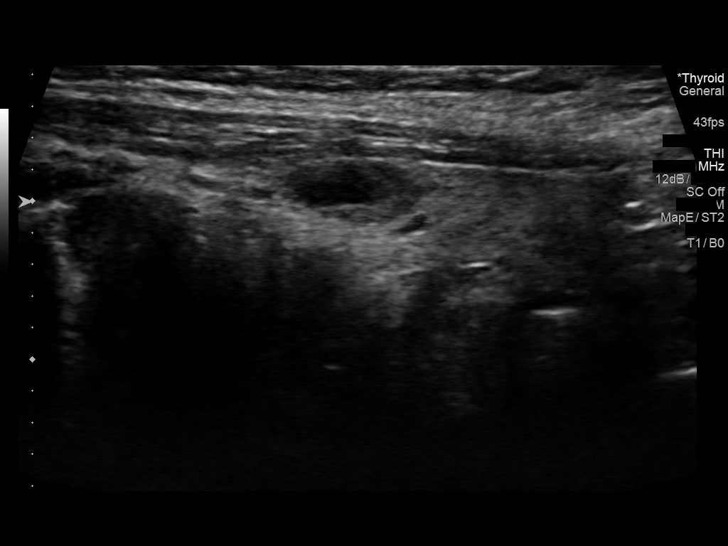
[im 52/69]
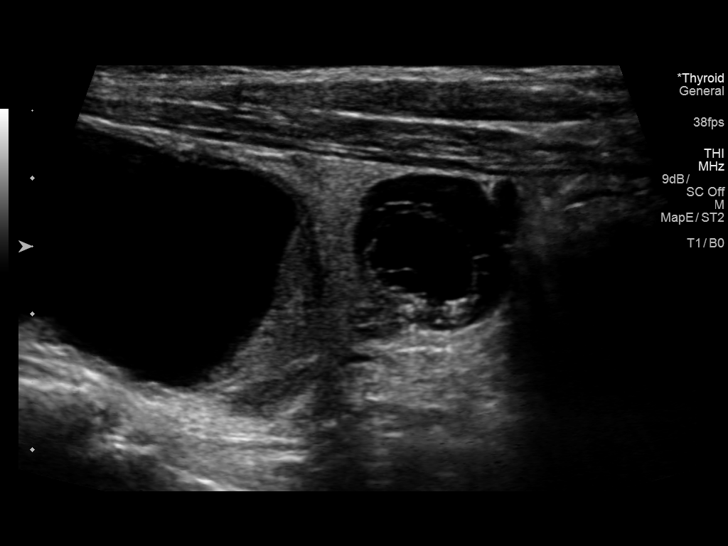
[im 57/69]
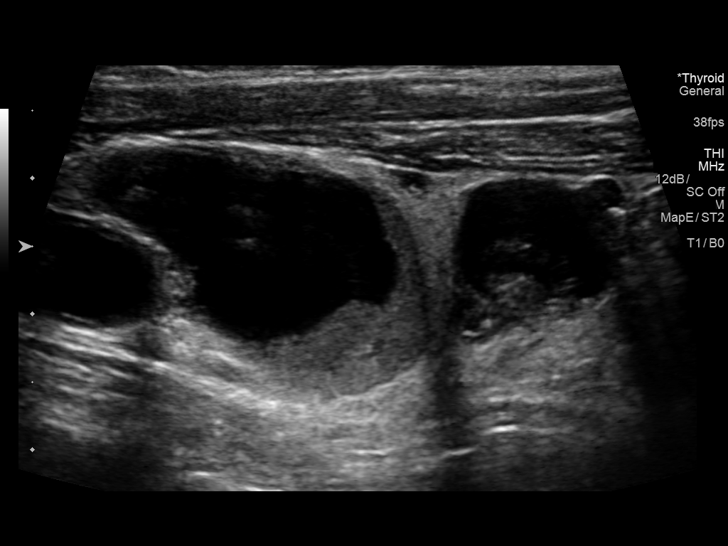
[im 63/69]
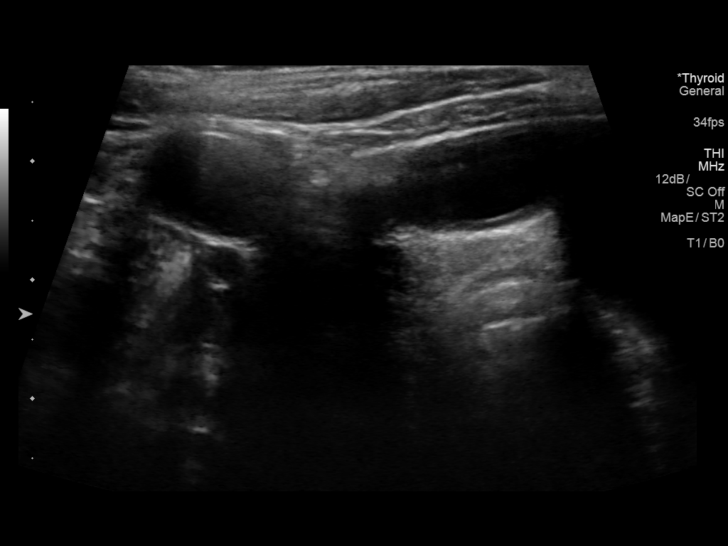
[im 69/69]
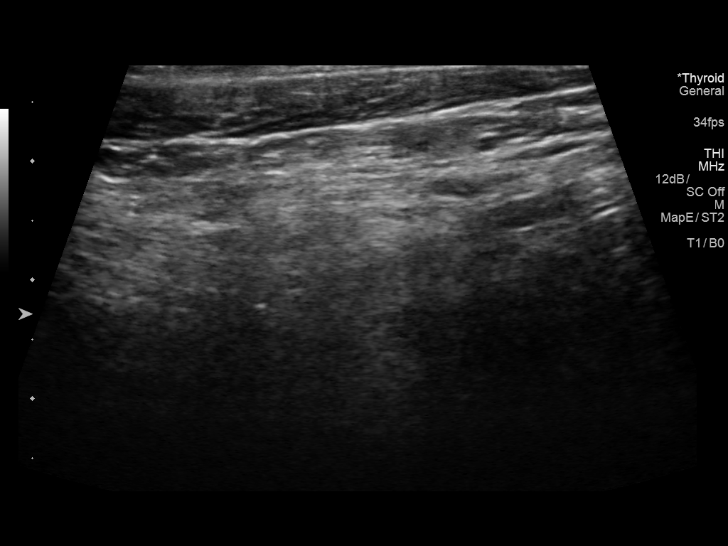

[13 of 25 positions shown; findings below may reference images not displayed]

FINDINGS: Parenchymal Echotexture: Moderately heterogenous

Isthmus: 0.3 cm, previously 0.4 cm

Right lobe: 3.9 x 2.0 x 2.6 cm, previously 4.3 x 2.2 x 2.5 cm

Left lobe: 5.4 x 2.1 x 2.6 cm, previously 4.7 x 1.5 x 2.8 cm

_________________________________________________________

Estimated total number of nodules >/= 1 cm: 4

Number of spongiform nodules >/=  2 cm not described below (TR1): 0

Number of mixed cystic and solid nodules >/= 1.5 cm not described
below (TR2): 0

_________________________________________________________

Dominant right thyroid nodule in the mid right thyroid lobe was
previously biopsied. This nodule is hypoechoic with some scattered
echogenic foci. Morphology of this nodule is unchanged. Nodule
measures 2.7 x 1.6 x 2.0 cm and previously measured 2.9 x 1.7 x
cm.

Stable hypoechoic nodule along the posterior right thyroid lobe
measuring up to 0.8 cm.

Again noted is a predominantly cystic nodule in the superior left
thyroid lobe. There is a small solid component along the inferior
aspect of this nodule. This predominately cystic nodule measures
x 1.9 x 2.2 cm and previously measured 2.9 x 1.6 x 2.1 cm. There
appears to be slightly increased fluid within this nodule rather
than increased solid component.

Again noted is a complex mixed cystic and solid nodule in the
inferior left thyroid lobe that measures 1.6 x 1.3 x 1.2 cm and
previously measured 1.4 x 1.1 x 1.2 cm.

Mixed cystic and solid nodule in the mid left thyroid lobe measures
up to 1.0 cm and this appears to be stable.
IMPRESSION: Multinodular goiter.

No significant change in the dominant nodules since 7386. Minimal
enlargement of the large cystic nodule in the left thyroid lobe as
described.

## 2020-01-01 DIAGNOSIS — R928 Other abnormal and inconclusive findings on diagnostic imaging of breast: Secondary | ICD-10-CM | POA: Diagnosis not present

## 2020-01-01 DIAGNOSIS — Z853 Personal history of malignant neoplasm of breast: Secondary | ICD-10-CM | POA: Diagnosis not present

## 2020-01-27 ENCOUNTER — Other Ambulatory Visit: Payer: Self-pay

## 2020-01-27 DIAGNOSIS — N6011 Diffuse cystic mastopathy of right breast: Secondary | ICD-10-CM | POA: Diagnosis not present

## 2020-01-27 DIAGNOSIS — R921 Mammographic calcification found on diagnostic imaging of breast: Secondary | ICD-10-CM | POA: Diagnosis not present

## 2020-03-03 DIAGNOSIS — E7800 Pure hypercholesterolemia, unspecified: Secondary | ICD-10-CM | POA: Diagnosis not present

## 2020-03-03 DIAGNOSIS — I10 Essential (primary) hypertension: Secondary | ICD-10-CM | POA: Diagnosis not present

## 2020-03-09 DIAGNOSIS — M85859 Other specified disorders of bone density and structure, unspecified thigh: Secondary | ICD-10-CM | POA: Diagnosis not present

## 2020-03-09 DIAGNOSIS — Z23 Encounter for immunization: Secondary | ICD-10-CM | POA: Diagnosis not present

## 2020-03-09 DIAGNOSIS — M150 Primary generalized (osteo)arthritis: Secondary | ICD-10-CM | POA: Diagnosis not present

## 2020-03-09 DIAGNOSIS — M069 Rheumatoid arthritis, unspecified: Secondary | ICD-10-CM | POA: Diagnosis not present

## 2020-03-09 DIAGNOSIS — F339 Major depressive disorder, recurrent, unspecified: Secondary | ICD-10-CM | POA: Diagnosis not present

## 2020-03-09 DIAGNOSIS — I10 Essential (primary) hypertension: Secondary | ICD-10-CM | POA: Diagnosis not present

## 2020-03-09 DIAGNOSIS — Z0000 Encounter for general adult medical examination without abnormal findings: Secondary | ICD-10-CM | POA: Diagnosis not present

## 2020-03-09 DIAGNOSIS — E7800 Pure hypercholesterolemia, unspecified: Secondary | ICD-10-CM | POA: Diagnosis not present

## 2020-03-15 DIAGNOSIS — M25552 Pain in left hip: Secondary | ICD-10-CM | POA: Diagnosis not present

## 2020-03-29 DIAGNOSIS — L814 Other melanin hyperpigmentation: Secondary | ICD-10-CM | POA: Diagnosis not present

## 2020-03-29 DIAGNOSIS — Z85820 Personal history of malignant melanoma of skin: Secondary | ICD-10-CM | POA: Diagnosis not present

## 2020-03-29 DIAGNOSIS — D224 Melanocytic nevi of scalp and neck: Secondary | ICD-10-CM | POA: Diagnosis not present

## 2020-03-29 DIAGNOSIS — L821 Other seborrheic keratosis: Secondary | ICD-10-CM | POA: Diagnosis not present

## 2020-03-29 DIAGNOSIS — L853 Xerosis cutis: Secondary | ICD-10-CM | POA: Diagnosis not present

## 2020-03-29 DIAGNOSIS — D220 Melanocytic nevi of lip: Secondary | ICD-10-CM | POA: Diagnosis not present

## 2020-03-29 DIAGNOSIS — D225 Melanocytic nevi of trunk: Secondary | ICD-10-CM | POA: Diagnosis not present

## 2020-03-29 DIAGNOSIS — L300 Nummular dermatitis: Secondary | ICD-10-CM | POA: Diagnosis not present

## 2020-03-29 DIAGNOSIS — Z85828 Personal history of other malignant neoplasm of skin: Secondary | ICD-10-CM | POA: Diagnosis not present

## 2020-04-05 DIAGNOSIS — Z96641 Presence of right artificial hip joint: Secondary | ICD-10-CM | POA: Diagnosis not present

## 2020-06-23 DIAGNOSIS — H353131 Nonexudative age-related macular degeneration, bilateral, early dry stage: Secondary | ICD-10-CM | POA: Diagnosis not present

## 2020-11-11 DIAGNOSIS — R030 Elevated blood-pressure reading, without diagnosis of hypertension: Secondary | ICD-10-CM | POA: Diagnosis not present

## 2020-11-11 DIAGNOSIS — L089 Local infection of the skin and subcutaneous tissue, unspecified: Secondary | ICD-10-CM | POA: Diagnosis not present

## 2020-11-11 DIAGNOSIS — I10 Essential (primary) hypertension: Secondary | ICD-10-CM | POA: Diagnosis not present

## 2020-11-11 DIAGNOSIS — S81802A Unspecified open wound, left lower leg, initial encounter: Secondary | ICD-10-CM | POA: Diagnosis not present

## 2020-11-18 DIAGNOSIS — I160 Hypertensive urgency: Secondary | ICD-10-CM | POA: Diagnosis not present

## 2020-11-18 DIAGNOSIS — N281 Cyst of kidney, acquired: Secondary | ICD-10-CM | POA: Diagnosis not present

## 2020-11-18 DIAGNOSIS — I10 Essential (primary) hypertension: Secondary | ICD-10-CM | POA: Diagnosis not present

## 2020-11-23 DIAGNOSIS — C44729 Squamous cell carcinoma of skin of left lower limb, including hip: Secondary | ICD-10-CM | POA: Diagnosis not present

## 2020-11-23 DIAGNOSIS — Z85828 Personal history of other malignant neoplasm of skin: Secondary | ICD-10-CM | POA: Diagnosis not present

## 2020-11-25 DIAGNOSIS — I10 Essential (primary) hypertension: Secondary | ICD-10-CM | POA: Diagnosis not present

## 2020-12-22 DIAGNOSIS — Z23 Encounter for immunization: Secondary | ICD-10-CM | POA: Diagnosis not present

## 2020-12-22 DIAGNOSIS — I10 Essential (primary) hypertension: Secondary | ICD-10-CM | POA: Diagnosis not present

## 2021-03-11 DIAGNOSIS — I10 Essential (primary) hypertension: Secondary | ICD-10-CM | POA: Diagnosis not present

## 2021-03-11 DIAGNOSIS — E7801 Familial hypercholesterolemia: Secondary | ICD-10-CM | POA: Diagnosis not present

## 2021-03-15 DIAGNOSIS — M85859 Other specified disorders of bone density and structure, unspecified thigh: Secondary | ICD-10-CM | POA: Diagnosis not present

## 2021-03-15 DIAGNOSIS — E7800 Pure hypercholesterolemia, unspecified: Secondary | ICD-10-CM | POA: Diagnosis not present

## 2021-03-15 DIAGNOSIS — I10 Essential (primary) hypertension: Secondary | ICD-10-CM | POA: Diagnosis not present

## 2021-03-15 DIAGNOSIS — Z0000 Encounter for general adult medical examination without abnormal findings: Secondary | ICD-10-CM | POA: Diagnosis not present

## 2021-03-15 DIAGNOSIS — M069 Rheumatoid arthritis, unspecified: Secondary | ICD-10-CM | POA: Diagnosis not present

## 2021-03-15 DIAGNOSIS — R29898 Other symptoms and signs involving the musculoskeletal system: Secondary | ICD-10-CM | POA: Diagnosis not present

## 2021-03-28 DIAGNOSIS — L570 Actinic keratosis: Secondary | ICD-10-CM | POA: Diagnosis not present

## 2021-03-28 DIAGNOSIS — L738 Other specified follicular disorders: Secondary | ICD-10-CM | POA: Diagnosis not present

## 2021-03-28 DIAGNOSIS — L821 Other seborrheic keratosis: Secondary | ICD-10-CM | POA: Diagnosis not present

## 2021-03-28 DIAGNOSIS — Z85828 Personal history of other malignant neoplasm of skin: Secondary | ICD-10-CM | POA: Diagnosis not present

## 2021-04-12 DIAGNOSIS — M546 Pain in thoracic spine: Secondary | ICD-10-CM | POA: Diagnosis not present

## 2021-04-12 DIAGNOSIS — M545 Low back pain, unspecified: Secondary | ICD-10-CM | POA: Diagnosis not present

## 2021-04-15 NOTE — Progress Notes (Signed)
Andrea Vasquez D.[place] Phone: [phone]   Assessment and Plan:     1. Chronic bilateral low back pain without sciatica 2. DDD (degenerative disc disease), lumbar -Chronic with exacerbation, initial sports medicine visit - Recurrence of chronic low and mid back pain with a particularly bad flare 2 weeks ago that has significantly improved with rest, Tylenol, tramadol - Reviewed x-rays from outside source the patient brought in.  Discussed my interpretation with patient in room.  My interpretation: No acute fracture or vertebral collapse.  Moderate to significant DDD throughout thoracic and lumbar spine.  Grade 1 anterolisthesis of L4 on L5, mild scoliotic curve lumbar spine, increased kyphosis of thoracic spine - Likely an acute flare of chronic DDD - To decrease recurrences, recommend starting HEP for core and low back exercises.  Patient currently seeing a physical therapist for quadricep rehab, and she may incorporate core and low back exercises with this PT - For pain flares in the future, recommend using Tylenol 500 mg tablets 2 tablets (1000 mg total) 2-3 times a day - Do not recommend regular use of NSAIDs due to patient age and history of GI bleed   Pertinent previous records reviewed include lumbar x-ray and thoracic spine x-ray from 04/12/2021 provided on CD from outside source   Follow Up: As needed if no improvement or worsening of symptoms   Subjective:   I, Andrea Vasquez, am serving as a Education administrator for [employer]  Chief Complaint: mid back  pain   HPI:   04/18/2021 Patient is a 86 year old female complaining of low back pain. Patient states that its all the way across her mid back , been going for 2 weeks has been getting better no MOI, has arthritis everywhere , stays in the mid back starts on a little trigger point on her left side , has been taking tylenol has been doing some heat,  lying down getting into and out of bed make the pain increase but has gotten better since yesterday she feels almost normal has been taking tramadol with tylenol.    Relevant Historical Information: GI bleed, hypertension, OA of multiple joints  Additional pertinent review of systems negative.   Current Outpatient Medications:    acetaminophen (TYLENOL) 325 MG tablet, Take 2 tablets (650 mg total) by mouth every 6 (six) hours as needed for mild pain (or Fever >/= 101)., Disp: 65 tablet, Rfl: 0   mirtazapine (REMERON) 15 MG tablet, Take 15 mg by mouth at bedtime., Disp: , Rfl:    Multiple Vitamins-Minerals (PRESERVISION AREDS PO), Take by mouth., Disp: , Rfl:    Omega-3 Fatty Acids (OMEGA 3 PO), Take by mouth., Disp: , Rfl:    rosuvastatin (CRESTOR) 5 MG tablet, Take 5 mg by mouth every evening., Disp: , Rfl:    Objective:     Vitals:   04/18/21 1015  BP: 120/80  Pulse: 99  SpO2: 99%  Weight: 161 lb (73 kg)  Height: 5\' 9"  (1.753 m)      Body mass index is 23.78 kg/m.    Physical Exam:    Gen: Appears well, nad, nontoxic and pleasant Psych: Alert and oriented, appropriate mood and affect Neuro: sensation intact, strength is 5/5 in upper and lower extremities, muscle tone wnl Skin: no susupicious lesions or rashes  Back - Normal skin, Spine with normal alignment.  Increased kyphotic curve of thoracic spine No tenderness to vertebral process palpation.  Paraspinous muscles are not tender and without spasm Straight leg raise negative   Electronically signed by:  Andrea Vasquez D.[facility] 10:50 AM 04/18/21

## 2021-04-18 ENCOUNTER — Other Ambulatory Visit: Payer: Self-pay

## 2021-04-18 ENCOUNTER — Ambulatory Visit: Payer: Medicare PPO | Admitting: Sports Medicine

## 2021-04-18 VITALS — BP 120/80 | HR 99 | Ht 69.0 in | Wt 161.0 lb

## 2021-04-18 DIAGNOSIS — M545 Low back pain, unspecified: Secondary | ICD-10-CM

## 2021-04-18 DIAGNOSIS — M5136 Other intervertebral disc degeneration, lumbar region: Secondary | ICD-10-CM

## 2021-04-18 DIAGNOSIS — G8929 Other chronic pain: Secondary | ICD-10-CM

## 2021-04-18 NOTE — Patient Instructions (Addendum)
Good to see you  Core and mid back HEP  Start with physical therapist  Tylenol with pain flares 500 mg tablets 2 tablets for 1,000 total 2-3 times a day  As needed follow up

## 2021-04-20 DIAGNOSIS — M532X4 Spinal instabilities, thoracic region: Secondary | ICD-10-CM | POA: Diagnosis not present

## 2021-04-20 DIAGNOSIS — R2689 Other abnormalities of gait and mobility: Secondary | ICD-10-CM | POA: Diagnosis not present

## 2021-04-20 DIAGNOSIS — M546 Pain in thoracic spine: Secondary | ICD-10-CM | POA: Diagnosis not present

## 2021-04-22 DIAGNOSIS — M532X4 Spinal instabilities, thoracic region: Secondary | ICD-10-CM | POA: Diagnosis not present

## 2021-04-22 DIAGNOSIS — R2689 Other abnormalities of gait and mobility: Secondary | ICD-10-CM | POA: Diagnosis not present

## 2021-04-22 DIAGNOSIS — M546 Pain in thoracic spine: Secondary | ICD-10-CM | POA: Diagnosis not present

## 2021-04-26 DIAGNOSIS — M532X4 Spinal instabilities, thoracic region: Secondary | ICD-10-CM | POA: Diagnosis not present

## 2021-04-26 DIAGNOSIS — E7801 Familial hypercholesterolemia: Secondary | ICD-10-CM | POA: Diagnosis not present

## 2021-04-26 DIAGNOSIS — R2689 Other abnormalities of gait and mobility: Secondary | ICD-10-CM | POA: Diagnosis not present

## 2021-04-26 DIAGNOSIS — M546 Pain in thoracic spine: Secondary | ICD-10-CM | POA: Diagnosis not present

## 2021-04-29 DIAGNOSIS — M546 Pain in thoracic spine: Secondary | ICD-10-CM | POA: Diagnosis not present

## 2021-04-29 DIAGNOSIS — M532X4 Spinal instabilities, thoracic region: Secondary | ICD-10-CM | POA: Diagnosis not present

## 2021-04-29 DIAGNOSIS — R2689 Other abnormalities of gait and mobility: Secondary | ICD-10-CM | POA: Diagnosis not present

## 2021-05-05 DIAGNOSIS — R2689 Other abnormalities of gait and mobility: Secondary | ICD-10-CM | POA: Diagnosis not present

## 2021-05-05 DIAGNOSIS — M532X4 Spinal instabilities, thoracic region: Secondary | ICD-10-CM | POA: Diagnosis not present

## 2021-05-05 DIAGNOSIS — M546 Pain in thoracic spine: Secondary | ICD-10-CM | POA: Diagnosis not present

## 2021-05-09 DIAGNOSIS — M546 Pain in thoracic spine: Secondary | ICD-10-CM | POA: Diagnosis not present

## 2021-05-09 DIAGNOSIS — R2689 Other abnormalities of gait and mobility: Secondary | ICD-10-CM | POA: Diagnosis not present

## 2021-05-09 DIAGNOSIS — M532X4 Spinal instabilities, thoracic region: Secondary | ICD-10-CM | POA: Diagnosis not present

## 2021-05-12 DIAGNOSIS — M546 Pain in thoracic spine: Secondary | ICD-10-CM | POA: Diagnosis not present

## 2021-05-12 DIAGNOSIS — M532X4 Spinal instabilities, thoracic region: Secondary | ICD-10-CM | POA: Diagnosis not present

## 2021-05-12 DIAGNOSIS — R2689 Other abnormalities of gait and mobility: Secondary | ICD-10-CM | POA: Diagnosis not present

## 2021-05-17 DIAGNOSIS — R2689 Other abnormalities of gait and mobility: Secondary | ICD-10-CM | POA: Diagnosis not present

## 2021-05-17 DIAGNOSIS — M532X4 Spinal instabilities, thoracic region: Secondary | ICD-10-CM | POA: Diagnosis not present

## 2021-05-17 DIAGNOSIS — M546 Pain in thoracic spine: Secondary | ICD-10-CM | POA: Diagnosis not present

## 2021-05-19 DIAGNOSIS — M546 Pain in thoracic spine: Secondary | ICD-10-CM | POA: Diagnosis not present

## 2021-05-19 DIAGNOSIS — R2689 Other abnormalities of gait and mobility: Secondary | ICD-10-CM | POA: Diagnosis not present

## 2021-05-19 DIAGNOSIS — M532X4 Spinal instabilities, thoracic region: Secondary | ICD-10-CM | POA: Diagnosis not present

## 2021-05-23 DIAGNOSIS — M532X4 Spinal instabilities, thoracic region: Secondary | ICD-10-CM | POA: Diagnosis not present

## 2021-05-23 DIAGNOSIS — M546 Pain in thoracic spine: Secondary | ICD-10-CM | POA: Diagnosis not present

## 2021-05-23 DIAGNOSIS — R2689 Other abnormalities of gait and mobility: Secondary | ICD-10-CM | POA: Diagnosis not present

## 2021-05-27 DIAGNOSIS — M546 Pain in thoracic spine: Secondary | ICD-10-CM | POA: Diagnosis not present

## 2021-05-27 DIAGNOSIS — R2689 Other abnormalities of gait and mobility: Secondary | ICD-10-CM | POA: Diagnosis not present

## 2021-05-27 DIAGNOSIS — M532X4 Spinal instabilities, thoracic region: Secondary | ICD-10-CM | POA: Diagnosis not present

## 2021-06-01 DIAGNOSIS — R2689 Other abnormalities of gait and mobility: Secondary | ICD-10-CM | POA: Diagnosis not present

## 2021-06-01 DIAGNOSIS — M532X4 Spinal instabilities, thoracic region: Secondary | ICD-10-CM | POA: Diagnosis not present

## 2021-06-01 DIAGNOSIS — M546 Pain in thoracic spine: Secondary | ICD-10-CM | POA: Diagnosis not present

## 2021-06-07 DIAGNOSIS — M532X4 Spinal instabilities, thoracic region: Secondary | ICD-10-CM | POA: Diagnosis not present

## 2021-06-07 DIAGNOSIS — M546 Pain in thoracic spine: Secondary | ICD-10-CM | POA: Diagnosis not present

## 2021-06-07 DIAGNOSIS — R2689 Other abnormalities of gait and mobility: Secondary | ICD-10-CM | POA: Diagnosis not present

## 2021-06-09 DIAGNOSIS — R2689 Other abnormalities of gait and mobility: Secondary | ICD-10-CM | POA: Diagnosis not present

## 2021-06-09 DIAGNOSIS — M546 Pain in thoracic spine: Secondary | ICD-10-CM | POA: Diagnosis not present

## 2021-06-09 DIAGNOSIS — M532X4 Spinal instabilities, thoracic region: Secondary | ICD-10-CM | POA: Diagnosis not present

## 2021-06-13 DIAGNOSIS — R2689 Other abnormalities of gait and mobility: Secondary | ICD-10-CM | POA: Diagnosis not present

## 2021-06-13 DIAGNOSIS — M546 Pain in thoracic spine: Secondary | ICD-10-CM | POA: Diagnosis not present

## 2021-06-13 DIAGNOSIS — M532X4 Spinal instabilities, thoracic region: Secondary | ICD-10-CM | POA: Diagnosis not present

## 2021-06-17 DIAGNOSIS — M532X4 Spinal instabilities, thoracic region: Secondary | ICD-10-CM | POA: Diagnosis not present

## 2021-06-17 DIAGNOSIS — M546 Pain in thoracic spine: Secondary | ICD-10-CM | POA: Diagnosis not present

## 2021-06-17 DIAGNOSIS — R2689 Other abnormalities of gait and mobility: Secondary | ICD-10-CM | POA: Diagnosis not present

## 2021-06-22 DIAGNOSIS — M546 Pain in thoracic spine: Secondary | ICD-10-CM | POA: Diagnosis not present

## 2021-06-22 DIAGNOSIS — R2689 Other abnormalities of gait and mobility: Secondary | ICD-10-CM | POA: Diagnosis not present

## 2021-06-22 DIAGNOSIS — M532X4 Spinal instabilities, thoracic region: Secondary | ICD-10-CM | POA: Diagnosis not present

## 2021-06-30 DIAGNOSIS — M546 Pain in thoracic spine: Secondary | ICD-10-CM | POA: Diagnosis not present

## 2021-06-30 DIAGNOSIS — R2689 Other abnormalities of gait and mobility: Secondary | ICD-10-CM | POA: Diagnosis not present

## 2021-06-30 DIAGNOSIS — M532X4 Spinal instabilities, thoracic region: Secondary | ICD-10-CM | POA: Diagnosis not present

## 2021-07-07 DIAGNOSIS — M532X4 Spinal instabilities, thoracic region: Secondary | ICD-10-CM | POA: Diagnosis not present

## 2021-07-07 DIAGNOSIS — R2689 Other abnormalities of gait and mobility: Secondary | ICD-10-CM | POA: Diagnosis not present

## 2021-07-07 DIAGNOSIS — M546 Pain in thoracic spine: Secondary | ICD-10-CM | POA: Diagnosis not present

## 2021-07-19 DIAGNOSIS — M532X4 Spinal instabilities, thoracic region: Secondary | ICD-10-CM | POA: Diagnosis not present

## 2021-07-19 DIAGNOSIS — R2689 Other abnormalities of gait and mobility: Secondary | ICD-10-CM | POA: Diagnosis not present

## 2021-07-19 DIAGNOSIS — M546 Pain in thoracic spine: Secondary | ICD-10-CM | POA: Diagnosis not present

## 2021-09-26 DIAGNOSIS — L821 Other seborrheic keratosis: Secondary | ICD-10-CM | POA: Diagnosis not present

## 2021-09-26 DIAGNOSIS — L820 Inflamed seborrheic keratosis: Secondary | ICD-10-CM | POA: Diagnosis not present

## 2021-09-26 DIAGNOSIS — M7130 Other bursal cyst, unspecified site: Secondary | ICD-10-CM | POA: Diagnosis not present

## 2021-09-26 DIAGNOSIS — Z85820 Personal history of malignant melanoma of skin: Secondary | ICD-10-CM | POA: Diagnosis not present

## 2021-09-26 DIAGNOSIS — D485 Neoplasm of uncertain behavior of skin: Secondary | ICD-10-CM | POA: Diagnosis not present

## 2021-09-26 DIAGNOSIS — Z85828 Personal history of other malignant neoplasm of skin: Secondary | ICD-10-CM | POA: Diagnosis not present

## 2021-09-26 DIAGNOSIS — D1801 Hemangioma of skin and subcutaneous tissue: Secondary | ICD-10-CM | POA: Diagnosis not present

## 2021-09-26 DIAGNOSIS — L570 Actinic keratosis: Secondary | ICD-10-CM | POA: Diagnosis not present

## 2021-09-26 DIAGNOSIS — D2272 Melanocytic nevi of left lower limb, including hip: Secondary | ICD-10-CM | POA: Diagnosis not present

## 2022-01-31 DIAGNOSIS — H353131 Nonexudative age-related macular degeneration, bilateral, early dry stage: Secondary | ICD-10-CM | POA: Diagnosis not present

## 2022-01-31 DIAGNOSIS — Z961 Presence of intraocular lens: Secondary | ICD-10-CM | POA: Diagnosis not present

## 2022-01-31 DIAGNOSIS — H16223 Keratoconjunctivitis sicca, not specified as Sjogren's, bilateral: Secondary | ICD-10-CM | POA: Diagnosis not present

## 2022-02-15 DIAGNOSIS — M2559 Pain in other specified joint: Secondary | ICD-10-CM | POA: Diagnosis not present

## 2022-02-15 DIAGNOSIS — Z6823 Body mass index (BMI) 23.0-23.9, adult: Secondary | ICD-10-CM | POA: Diagnosis not present

## 2022-02-15 DIAGNOSIS — M7989 Other specified soft tissue disorders: Secondary | ICD-10-CM | POA: Diagnosis not present

## 2022-02-15 DIAGNOSIS — M5136 Other intervertebral disc degeneration, lumbar region: Secondary | ICD-10-CM | POA: Diagnosis not present

## 2022-02-15 DIAGNOSIS — M1991 Primary osteoarthritis, unspecified site: Secondary | ICD-10-CM | POA: Diagnosis not present

## 2022-02-15 DIAGNOSIS — M25561 Pain in right knee: Secondary | ICD-10-CM | POA: Diagnosis not present

## 2022-02-15 DIAGNOSIS — R5383 Other fatigue: Secondary | ICD-10-CM | POA: Diagnosis not present

## 2022-02-15 DIAGNOSIS — R768 Other specified abnormal immunological findings in serum: Secondary | ICD-10-CM | POA: Diagnosis not present

## 2022-03-09 DIAGNOSIS — R768 Other specified abnormal immunological findings in serum: Secondary | ICD-10-CM | POA: Diagnosis not present

## 2022-03-09 DIAGNOSIS — M25561 Pain in right knee: Secondary | ICD-10-CM | POA: Diagnosis not present

## 2022-03-09 DIAGNOSIS — M1991 Primary osteoarthritis, unspecified site: Secondary | ICD-10-CM | POA: Diagnosis not present

## 2022-03-09 DIAGNOSIS — Z6824 Body mass index (BMI) 24.0-24.9, adult: Secondary | ICD-10-CM | POA: Diagnosis not present

## 2022-03-09 DIAGNOSIS — M5136 Other intervertebral disc degeneration, lumbar region: Secondary | ICD-10-CM | POA: Diagnosis not present

## 2022-03-16 DIAGNOSIS — E7800 Pure hypercholesterolemia, unspecified: Secondary | ICD-10-CM | POA: Diagnosis not present

## 2022-03-21 DIAGNOSIS — M85859 Other specified disorders of bone density and structure, unspecified thigh: Secondary | ICD-10-CM | POA: Diagnosis not present

## 2022-03-21 DIAGNOSIS — Z0000 Encounter for general adult medical examination without abnormal findings: Secondary | ICD-10-CM | POA: Diagnosis not present

## 2022-03-21 DIAGNOSIS — I10 Essential (primary) hypertension: Secondary | ICD-10-CM | POA: Diagnosis not present

## 2022-03-21 DIAGNOSIS — E7800 Pure hypercholesterolemia, unspecified: Secondary | ICD-10-CM | POA: Diagnosis not present

## 2022-03-21 DIAGNOSIS — F339 Major depressive disorder, recurrent, unspecified: Secondary | ICD-10-CM | POA: Diagnosis not present

## 2022-03-21 DIAGNOSIS — Z23 Encounter for immunization: Secondary | ICD-10-CM | POA: Diagnosis not present

## 2022-03-21 DIAGNOSIS — M069 Rheumatoid arthritis, unspecified: Secondary | ICD-10-CM | POA: Diagnosis not present

## 2022-05-02 DIAGNOSIS — M810 Age-related osteoporosis without current pathological fracture: Secondary | ICD-10-CM | POA: Diagnosis not present

## 2022-05-02 DIAGNOSIS — I10 Essential (primary) hypertension: Secondary | ICD-10-CM | POA: Diagnosis not present

## 2022-05-02 DIAGNOSIS — F339 Major depressive disorder, recurrent, unspecified: Secondary | ICD-10-CM | POA: Diagnosis not present

## 2022-05-16 DIAGNOSIS — H353131 Nonexudative age-related macular degeneration, bilateral, early dry stage: Secondary | ICD-10-CM | POA: Diagnosis not present

## 2022-07-05 DIAGNOSIS — M25561 Pain in right knee: Secondary | ICD-10-CM | POA: Diagnosis not present

## 2022-08-04 DIAGNOSIS — M1711 Unilateral primary osteoarthritis, right knee: Secondary | ICD-10-CM | POA: Diagnosis not present

## 2022-08-21 DIAGNOSIS — Z01818 Encounter for other preprocedural examination: Secondary | ICD-10-CM | POA: Diagnosis not present

## 2022-08-21 DIAGNOSIS — E010 Iodine-deficiency related diffuse (endemic) goiter: Secondary | ICD-10-CM | POA: Diagnosis not present

## 2022-08-22 ENCOUNTER — Other Ambulatory Visit: Payer: Self-pay | Admitting: Registered Nurse

## 2022-08-22 DIAGNOSIS — E010 Iodine-deficiency related diffuse (endemic) goiter: Secondary | ICD-10-CM

## 2022-08-25 ENCOUNTER — Ambulatory Visit
Admission: RE | Admit: 2022-08-25 | Discharge: 2022-08-25 | Disposition: A | Discharge status: Home / Self Care | Payer: Medicare PPO | Source: Ambulatory Visit | Attending: Registered Nurse | Admitting: Registered Nurse

## 2022-08-25 DIAGNOSIS — E010 Iodine-deficiency related diffuse (endemic) goiter: Secondary | ICD-10-CM

## 2022-08-25 DIAGNOSIS — E041 Nontoxic single thyroid nodule: Secondary | ICD-10-CM | POA: Diagnosis not present

## 2022-08-25 DIAGNOSIS — E079 Disorder of thyroid, unspecified: Secondary | ICD-10-CM | POA: Diagnosis not present

## 2022-10-03 DIAGNOSIS — M25561 Pain in right knee: Secondary | ICD-10-CM | POA: Diagnosis not present

## 2022-10-03 DIAGNOSIS — M1711 Unilateral primary osteoarthritis, right knee: Secondary | ICD-10-CM | POA: Diagnosis not present

## 2022-10-03 DIAGNOSIS — M25661 Stiffness of right knee, not elsewhere classified: Secondary | ICD-10-CM | POA: Diagnosis not present

## 2022-10-09 NOTE — H&P (Signed)
TOTAL KNEE ADMISSION H&P  Patient is being admitted for right total knee arthroplasty.  Subjective:  Chief Complaint: Right knee pain.  HPI: [place], 87 y.o. female has a history of pain and functional disability in the right knee due to arthritis and has failed non-surgical conservative treatments for greater than 12 weeks to include corticosteriod injections, viscosupplementation injections, use of assistive devices, and activity modification. Onset of symptoms was gradual, starting 7 years ago with gradually worsening course since that time. The patient noted no past surgery on the right knee.  Patient currently rates pain in the right knee at 7 out of 10 with activity. Patient has worsening of pain with activity and weight bearing and pain that interferes with activities of daily living. Patient has evidence of subchondral sclerosis, periarticular osteophytes, and joint space narrowing by imaging studies. There is no active infection.  Patient Active Problem List   Diagnosis Date Noted   GI bleed 12/24/2013   Blood loss anemia 12/24/2013   HLD (hyperlipidemia) 12/24/2013   Hypertension 12/24/2013   Hypokalemia 05/15/2013   Postoperative anemia due to acute blood loss 05/15/2013   OA (osteoarthritis) of hip 05/13/2013    Past Medical History:  Diagnosis Date   Arthritis    Cancer (HCC) 1992   right breast   Depression    mild   Dysrhythmia    "heart fluttering-been checked out, nothing wrong"   Goiter    small on right side, monitored   Hypercholesteremia    controlled by medicine   Hypertension     Past Surgical History:  Procedure Laterality Date   BACK SURGERY  2002   lumbar, herniated disc   BREAST LUMPECTOMY Right 1992   ESOPHAGOGASTRODUODENOSCOPY N/A 12/25/2013   Procedure: ESOPHAGOGASTRODUODENOSCOPY (EGD);  Surgeon: Florencia Reasons, MD;  Location: [facility];  Service: Endoscopy;  Laterality: N/A;   TONSILLECTOMY  age 56   TOTAL HIP ARTHROPLASTY  Right 05/14/2013   Procedure: RIGHT TOTAL HIP ARTHROPLASTY ANTERIOR APPROACH;  Surgeon: Loanne Drilling, MD;  Location: [facility];  Service: Orthopedics;  Laterality: Right;    Prior to Admission medications   Medication Sig Start Date End Date Taking? Authorizing Provider  acetaminophen (TYLENOL) 325 MG tablet Take 2 tablets (650 mg total) by mouth every 6 (six) hours as needed for mild pain (or Fever >/= 101). 12/26/13   Dorothea Ogle, MD  mirtazapine (REMERON) 15 MG tablet Take 15 mg by mouth at bedtime.    [provider]  Multiple Vitamins-Minerals (PRESERVISION AREDS PO) Take by mouth.    [provider]  Omega-3 Fatty Acids (OMEGA 3 PO) Take by mouth.    [provider]  rosuvastatin (CRESTOR) 5 MG tablet Take 5 mg by mouth every evening.    [provider]    No Known Allergies  Social History   Socioeconomic History   Marital status: Divorced    Spouse name: Not on file   Number of children: Not on file   Years of education: Not on file   Highest education level: Not on file  Occupational History   Not on file  Tobacco Use   Smoking status: Former    Packs/day: 0.25    Years: 10.00    Additional pack years: 0.00    Total pack years: 2.50    Types: Cigarettes    Quit date: 04/10/1968    Years since quitting: 54.5   Smokeless tobacco: Never  Substance and Sexual Activity   Alcohol  use: Yes    Comment: glass of wine occasional   Drug use: No   Sexual activity: Not on file  Other Topics Concern   Not on file  Social History Narrative   Not on file   Social Determinants of Health   Financial Resource Strain: Not on file  Food Insecurity: Not on file  Transportation Needs: Not on file  Physical Activity: Not on file  Stress: Not on file  Social Connections: Not on file  Intimate Partner Violence: Not on file    Tobacco Use: Medium Risk (04/22/2020)   Patient History    Smoking Tobacco Use: Former    Smokeless Tobacco Use: Never     Passive Exposure: Not on file   Social History   Substance and Sexual Activity  Alcohol Use Yes   Comment: glass of wine occasional    Objective:  Physical Exam: - Well-developed female, alert and oriented, no apparent distress.   - Left knee shows no effusion. Range of motion left knee is 0-130. Slight tenderness laterally, no medial tenderness or instability.   - Right hip has normal range of motion, no discomfort.   - Right knee shows a trace effusion. Range of motion is about 10-125 with moderate crepitus on range of motion. Tender medial and lateral. No instability noted.   - Gait pattern is significantly antalgic on the right with a cane.    IMAGING:  Radiographs of both knees and lateral of the right from March 2024 demonstrate severe end-stage arthritis of the right knee. The arthritis is tricompartmental bone-on-bone with a slight valgus deformity and large osteophytes throughout. There is significant erosion of the patella. The left knee shows some medial narrowing.  Assessment/Plan:  End stage arthritis, right knee   The patient history, physical examination, clinical judgment of the provider and imaging studies are consistent with end stage degenerative joint disease of the right knee and total knee arthroplasty is deemed medically necessary. The treatment options including medical management, injection therapy arthroscopy and arthroplasty were discussed at length. The risks and benefits of total knee arthroplasty were presented and reviewed. The risks due to aseptic loosening, infection, stiffness, patella tracking problems, thromboembolic complications and other imponderables were discussed. The patient acknowledged the explanation, agreed to proceed with the plan and consent was signed. Patient is being admitted for inpatient treatment for surgery, pain control, PT, OT, prophylactic antibiotics, VTE prophylaxis, progressive ambulation and ADLs and discharge planning. The  patient is planning to be discharged  home with OPPT scheduled at [facility] .   Patient's anticipated LOS is less than 2 midnights, meeting these requirements: - Younger than 68 - Lives within 1 hour of care - Has a competent adult at home to recover with post-op recover - NO history of  - Chronic pain requiring opiods  - Diabetes  - Coronary Artery Disease  - Heart failure  - Heart attack  - Stroke  - DVT/VTE  - Cardiac arrhythmia  - Respiratory Failure/COPD  - Renal failure  - Anemia  - Advanced Liver disease  Therapy Plans: EO Disposition: Home with daughter Planned DVT Prophylaxis: Xarelto (hx breast cancer and ASA allergy) DME Needed: None PCP: Lauretta Chester, FNP (clearance received) TXA: IV Allergies: Aspirin (GI bleed) Anesthesia Concerns: None BMI: 23.9 Last HgbA1c: Not diabetic Pharmacy: [facility] on [facility].  - Patient was instructed on what medications to stop prior to surgery. - Follow-up visit in 2 weeks with Dr. Lequita Halt - Begin physical therapy following surgery -  Pre-operative lab work as pre-surgical testing - Prescriptions will be provided in hospital at time of discharge  Weston Brass, PA-C Orthopedic Surgery [facility] [place]

## 2022-10-20 NOTE — Progress Notes (Addendum)
COVID Vaccine Completed:  Yes  Date of COVID positive in last 88 days:No  PCP - Merri Brunette, MD Cardiologist - N/A  Medical clearance on chart  Chest x-ray -  N/A EKG - 08-21-22 on chart Stress Test -  N/A ECHO -  N/A Cardiac Cath -  N/A Pacemaker/ICD device last checked: Spinal Cord Stimulator: N/A  Bowel Prep -  N/A  Sleep Study -  N/A CPAP -   Fasting Blood Sugar -  N/A Checks Blood Sugar _____ times a day  Last dose of GLP1 agonist-  N/A GLP1 instructions:  N/A   Last dose of SGLT-2 inhibitors-  N/A SGLT-2 instructions: N/A  Blood Thinner Instructions:   N/A Aspirin Instructions: Last Dose:  Activity level:  Can go up a flight of stairs and perform activities of daily living without stopping and without symptoms of chest pain or shortness of breath.   Anesthesia review:  Decreasing R wave progression on EKG from PCP's office  Patient denies shortness of breath, fever, cough and chest pain at PAT appointment  Patient verbalized understanding of instructions that were given to them at the PAT appointment. Patient was also instructed that they will need to review over the PAT instructions again at home before surgery.

## 2022-10-20 NOTE — Patient Instructions (Addendum)
SURGICAL WAITING ROOM VISITATION Patients having surgery or a procedure may have no more than 2 support people in the waiting area - these visitors may rotate.    Children under the age of 24 must have an adult with them who is not the patient.  If the patient needs to stay at the hospital during part of their recovery, the visitor guidelines for inpatient rooms apply. Pre-op nurse will coordinate an appropriate time for 1 support person to accompany patient in pre-op.  This support person may not rotate.    Please refer to the [facility] website for the visitor guidelines for Inpatients (after your surgery is over and you are in a regular room).       Your procedure is scheduled on: 11-06-22   Report to [facility] Main Entrance    Report to admitting at 6:50 AM   Call this number if you have problems the morning of surgery [phone]   Do not eat food :After Midnight.   After Midnight you may have the following liquids until 6:25 AM DAY OF SURGERY  Water Non-Citrus Juices (without pulp, NO RED-Apple, White grape, White cranberry) Black Coffee (NO MILK/CREAM OR CREAMERS, sugar ok)  Clear Tea (NO MILK/CREAM OR CREAMERS, sugar ok) regular and decaf                             Plain Jell-O (NO RED)                                           Fruit ices (not with fruit pulp, NO RED)                                     Popsicles (NO RED)                                                               Sports drinks like Gatorade (NO RED)                   The day of surgery:  Drink ONE (1) Pre-Surgery Clear Ensure at 6:25 AM the morning of surgery. Drink in one sitting. Do not sip.  This drink was given to you during your hospital  pre-op appointment visit. Nothing else to drink after completing the Pre-Surgery Clear Ensure.          If you have questions, please contact your surgeon's office.   FOLLOW ANY ADDITIONAL PRE OP INSTRUCTIONS YOU RECEIVED FROM YOUR SURGEON'S  OFFICE!!!     Oral Hygiene is also important to reduce your risk of infection.                                    Remember - BRUSH YOUR TEETH THE MORNING OF SURGERY WITH YOUR REGULAR TOOTHPASTE   Do NOT smoke after Midnight   Take these medicines the morning of surgery with A SIP OF WATER:   Tylenol if needed  You may not have any metal on your body including hair pins, jewelry, and body piercing             Do not wear make-up, lotions, powders, perfume, or deodorant  Do not wear nail polish including gel and S&S, artificial/acrylic nails, or any other type of covering on natural nails including finger and toenails. If you have artificial nails, gel coating, etc. that needs to be removed by a nail salon please have this removed prior to surgery or surgery may need to be canceled/ delayed if the surgeon/ anesthesia feels like they are unable to be safely monitored.   Do not shave  48 hours prior to surgery.    Do not bring valuables to the hospital.  IS NOT RESPONSIBLE   FOR VALUABLES.   Contacts, dentures or bridgework may not be worn into surgery.   Bring small overnight bag day of surgery.   DO NOT BRING YOUR HOME MEDICATIONS TO THE HOSPITAL. PHARMACY WILL DISPENSE MEDICATIONS LISTED ON YOUR MEDICATION LIST TO YOU DURING YOUR ADMISSION IN THE HOSPITAL!    Special Instructions: Bring a copy of your healthcare power of attorney and living will documents the day of surgery if you haven't scanned them before.              Please read over the following fact sheets you were given: IF YOU HAVE QUESTIONS ABOUT YOUR PRE-OP INSTRUCTIONS PLEASE CALL [phone] Gwen  If you received a COVID test during your pre-op visit  it is requested that you wear a mask when out in public, stay away from anyone that may not be feeling well and notify your surgeon if you develop symptoms. If you test positive for Covid or have been in contact with anyone that has  tested positive in the last 10 days please notify you surgeon.    Pre-operative 5 CHG Bath Instructions   You can play a key role in reducing the risk of infection after surgery. Your skin needs to be as free of germs as possible. You can reduce the number of germs on your skin by washing with CHG (chlorhexidine gluconate) soap before surgery. CHG is an antiseptic soap that kills germs and continues to kill germs even after washing.   DO NOT use if you have an allergy to chlorhexidine/CHG or antibacterial soaps. If your skin becomes reddened or irritated, stop using the CHG and notify one of our RNs at  [phone] .   Please shower with the CHG soap starting 4 days before surgery using the following schedule:     Please keep in mind the following:  DO NOT shave, including legs and underarms, starting the day of your first shower.   You may shave your face at any point before/day of surgery.  Place clean sheets on your bed the day you start using CHG soap. Use a clean washcloth (not used since being washed) for each shower. DO NOT sleep with pets once you start using the CHG.   CHG Shower Instructions:  If you choose to wash your hair and private area, wash first with your normal shampoo/soap.  After you use shampoo/soap, rinse your hair and body thoroughly to remove shampoo/soap residue.  Turn the water OFF and apply about 3 tablespoons (45 ml) of CHG soap to a CLEAN washcloth.  Apply CHG soap ONLY FROM YOUR NECK DOWN TO YOUR TOES (washing for 3-5 minutes)  DO NOT use CHG soap on face, private areas, open wounds,  or sores.  Pay special attention to the area where your surgery is being performed.  If you are having back surgery, having someone wash your back for you may be helpful. Wait 2 minutes after CHG soap is applied, then you may rinse off the CHG soap.  Pat dry with a clean towel  Put on clean clothes/pajamas   If you choose to wear lotion, please use ONLY the CHG-compatible  lotions on the back of this paper.     Additional instructions for the day of surgery: DO NOT APPLY any lotions, deodorants, cologne, or perfumes.   Put on clean/comfortable clothes.  Brush your teeth.  Ask your nurse before applying any prescription medications to the skin.      CHG Compatible Lotions   Aveeno Moisturizing lotion  Cetaphil Moisturizing Cream  Cetaphil Moisturizing Lotion  Clairol Herbal Essence Moisturizing Lotion, Dry Skin  Clairol Herbal Essence Moisturizing Lotion, Extra Dry Skin  Clairol Herbal Essence Moisturizing Lotion, Normal Skin  Curel Age Defying Therapeutic Moisturizing Lotion with Alpha Hydroxy  Curel Extreme Care Body Lotion  Curel Soothing Hands Moisturizing Hand Lotion  Curel Therapeutic Moisturizing Cream, Fragrance-Free  Curel Therapeutic Moisturizing Lotion, Fragrance-Free  Curel Therapeutic Moisturizing Lotion, Original Formula  Eucerin Daily Replenishing Lotion  Eucerin Dry Skin Therapy Plus Alpha Hydroxy Crme  Eucerin Dry Skin Therapy Plus Alpha Hydroxy Lotion  Eucerin Original Crme  Eucerin Original Lotion  Eucerin Plus Crme Eucerin Plus Lotion  Eucerin TriLipid Replenishing Lotion  Keri Anti-Bacterial Hand Lotion  Keri Deep Conditioning Original Lotion Dry Skin Formula Softly Scented  Keri Deep Conditioning Original Lotion, Fragrance Free Sensitive Skin Formula  Keri Lotion Fast Absorbing Fragrance Free Sensitive Skin Formula  Keri Lotion Fast Absorbing Softly Scented Dry Skin Formula  Keri Original Lotion  Keri Skin Renewal Lotion Keri Silky Smooth Lotion  Keri Silky Smooth Sensitive Skin Lotion  Nivea Body Creamy Conditioning Oil  Nivea Body Extra Enriched Lotion  Nivea Body Original Lotion  Nivea Body Sheer Moisturizing Lotion Nivea Crme  Nivea Skin Firming Lotion  NutraDerm 30 Skin Lotion  NutraDerm Skin Lotion  NutraDerm Therapeutic Skin Cream  NutraDerm Therapeutic Skin Lotion  ProShield Protective Hand Cream   Provon moisturizing lotion   PATIENT SIGNATURE_________________________________  NURSE SIGNATURE__________________________________  ________________________________________________________________________    Andrea Vasquez  An incentive spirometer is a tool that can help keep your lungs clear and active. This tool measures how well you are filling your lungs with each breath. Taking long deep breaths may help reverse or decrease the chance of developing breathing (pulmonary) problems (especially infection) following: A long period of time when you are unable to move or be active. BEFORE THE PROCEDURE  If the spirometer includes an indicator to show your best effort, your nurse or respiratory therapist will set it to a desired goal. If possible, sit up straight or lean slightly forward. Try not to slouch. Hold the incentive spirometer in an upright position. INSTRUCTIONS FOR USE  Sit on the edge of your bed if possible, or sit up as far as you can in bed or on a chair. Hold the incentive spirometer in an upright position. Breathe out normally. Place the mouthpiece in your mouth and seal your lips tightly around it. Breathe in slowly and as deeply as possible, raising the piston or the ball toward the top of the column. Hold your breath for 3-5 seconds or for as long as possible. Allow the piston or ball to fall to the bottom of the column.  Remove the mouthpiece from your mouth and breathe out normally. Rest for a few seconds and repeat Steps 1 through 7 at least 10 times every 1-2 hours when you are awake. Take your time and take a few normal breaths between deep breaths. The spirometer may include an indicator to show your best effort. Use the indicator as a goal to work toward during each repetition. After each set of 10 deep breaths, practice coughing to be sure your lungs are clear. If you have an incision (the cut made at the time of surgery), support your incision when coughing  by placing a pillow or rolled up towels firmly against it. Once you are able to get out of bed, walk around indoors and cough well. You may stop using the incentive spirometer when instructed by your caregiver.  RISKS AND COMPLICATIONS Take your time so you do not get dizzy or light-headed. If you are in pain, you may need to take or ask for pain medication before doing incentive spirometry. It is harder to take a deep breath if you are having pain. AFTER USE Rest and breathe slowly and easily. It can be helpful to keep track of a log of your progress. Your caregiver can provide you with a simple table to help with this. If you are using the spirometer at home, follow these instructions: SEEK MEDICAL CARE IF:  You are having difficultly using the spirometer. You have trouble using the spirometer as often as instructed. Your pain medication is not giving enough relief while using the spirometer. You develop fever of 100.5 F (38.1 C) or higher. SEEK IMMEDIATE MEDICAL CARE IF:  You cough up bloody sputum that had not been present before. You develop fever of 102 F (38.9 C) or greater. You develop worsening pain at or near the incision site. MAKE SURE YOU:  Understand these instructions. Will watch your condition. Will get help right away if you are not doing well or get worse. Document Released: 08/07/2006 Document Revised: 06/19/2011 Document Reviewed: 10/08/2006 [facility] Patient Information 2014 [place], [place].   ________________________________________________________________________

## 2022-10-24 ENCOUNTER — Encounter (HOSPITAL_COMMUNITY)
Admission: RE | Admit: 2022-10-24 | Discharge: 2022-10-24 | Disposition: A | Discharge status: Home / Self Care | Payer: Medicare PPO | Source: Ambulatory Visit | Attending: Orthopedic Surgery | Admitting: Orthopedic Surgery

## 2022-10-24 ENCOUNTER — Encounter (HOSPITAL_COMMUNITY): Payer: Self-pay

## 2022-10-24 ENCOUNTER — Other Ambulatory Visit: Payer: Self-pay

## 2022-10-24 VITALS — BP 142/82 | HR 84 | Temp 98.6°F | Resp 16 | Ht 70.0 in | Wt 164.4 lb

## 2022-10-24 DIAGNOSIS — Z01818 Encounter for other preprocedural examination: Secondary | ICD-10-CM | POA: Diagnosis not present

## 2022-10-24 DIAGNOSIS — I10 Essential (primary) hypertension: Secondary | ICD-10-CM | POA: Insufficient documentation

## 2022-10-24 HISTORY — DX: Malignant melanoma of skin, unspecified: C43.9

## 2022-10-24 LAB — BASIC METABOLIC PANEL
Anion gap: 12 (ref 5–15)
BUN: 19 mg/dL (ref 8–23)
CO2: 24 mmol/L (ref 22–32)
Calcium: 9.2 mg/dL (ref 8.9–10.3)
Chloride: 100 mmol/L (ref 98–111)
Creatinine, Ser: 0.95 mg/dL (ref 0.44–1.00)
GFR, Estimated: 59 mL/min — ABNORMAL LOW (ref >60)
Glucose, Bld: 102 mg/dL — ABNORMAL HIGH (ref 70–99)
Potassium: 3.9 mmol/L (ref 3.5–5.1)
Sodium: 136 mmol/L (ref 135–145)

## 2022-10-24 LAB — SURGICAL PCR SCREEN
MRSA, PCR: NEGATIVE
Staphylococcus aureus: NEGATIVE

## 2022-10-24 LAB — CBC
HCT: 42.4 % (ref 36.0–46.0)
Hemoglobin: 13.9 g/dL (ref 12.0–15.0)
MCH: 31 pg (ref 26.0–34.0)
MCHC: 32.8 g/dL (ref 30.0–36.0)
MCV: 94.6 fL (ref 80.0–100.0)
Platelets: 358 10*3/uL (ref 150–400)
RBC: 4.48 MIL/uL (ref 3.87–5.11)
RDW: 13.1 % (ref 11.5–15.5)
WBC: 6.8 10*3/uL (ref 4.0–10.5)
nRBC: 0 % (ref 0.0–0.2)

## 2022-10-30 DIAGNOSIS — M7130 Other bursal cyst, unspecified site: Secondary | ICD-10-CM | POA: Diagnosis not present

## 2022-10-30 DIAGNOSIS — L738 Other specified follicular disorders: Secondary | ICD-10-CM | POA: Diagnosis not present

## 2022-10-30 DIAGNOSIS — L821 Other seborrheic keratosis: Secondary | ICD-10-CM | POA: Diagnosis not present

## 2022-10-30 DIAGNOSIS — Z85828 Personal history of other malignant neoplasm of skin: Secondary | ICD-10-CM | POA: Diagnosis not present

## 2022-10-30 DIAGNOSIS — D2261 Melanocytic nevi of right upper limb, including shoulder: Secondary | ICD-10-CM | POA: Diagnosis not present

## 2022-10-30 DIAGNOSIS — D220 Melanocytic nevi of lip: Secondary | ICD-10-CM | POA: Diagnosis not present

## 2022-10-30 DIAGNOSIS — D224 Melanocytic nevi of scalp and neck: Secondary | ICD-10-CM | POA: Diagnosis not present

## 2022-10-30 DIAGNOSIS — D1801 Hemangioma of skin and subcutaneous tissue: Secondary | ICD-10-CM | POA: Diagnosis not present

## 2022-10-30 DIAGNOSIS — L570 Actinic keratosis: Secondary | ICD-10-CM | POA: Diagnosis not present

## 2022-11-05 NOTE — Anesthesia Preprocedure Evaluation (Addendum)
Anesthesia Evaluation  Patient identified by MRN, date of birth, ID band Patient awake    Reviewed: Allergy & Precautions, NPO status , Patient's Chart, lab work & pertinent test results  History of Anesthesia Complications Negative for: history of anesthetic complications  Airway Mallampati: II  TM Distance: >3 FB Neck ROM: Full    Dental no notable dental hx.    Pulmonary former smoker   Pulmonary exam normal        Cardiovascular hypertension, Pt. on medications Normal cardiovascular exam     Neuro/Psych    Depression    negative neurological ROS     GI/Hepatic negative GI ROS, Neg liver ROS,,,  Endo/Other  negative endocrine ROS    Renal/GU negative Renal ROS     Musculoskeletal  (+) Arthritis ,    Abdominal   Peds  Hematology negative hematology ROS (+)   Anesthesia Other Findings Day of surgery medications reviewed with patient.  Reproductive/Obstetrics                              Anesthesia Physical Anesthesia Plan  ASA: 2  Anesthesia Plan: Spinal   Post-op Pain Management:  Regional for Post-op pain and Regional block* and Ofirmev IV (intra-op)*   Induction:   PONV Risk Score and Plan: 3 and Treatment may vary due to age or medical condition, Ondansetron, Propofol infusion and Dexamethasone  Airway Management Planned: Natural Airway and Simple Face Mask  Additional Equipment: None  Intra-op Plan:   Post-operative Plan:   Informed Consent: I have reviewed the patients History and Physical, chart, labs and discussed the procedure including the risks, benefits and alternatives for the proposed anesthesia with the patient or authorized representative who has indicated his/her understanding and acceptance.     Dental advisory given  Plan Discussed with: CRNA  Anesthesia Plan Comments:         Anesthesia Quick Evaluation

## 2022-11-06 ENCOUNTER — Encounter (HOSPITAL_COMMUNITY): Payer: Self-pay | Admitting: Orthopedic Surgery

## 2022-11-06 ENCOUNTER — Ambulatory Visit (HOSPITAL_COMMUNITY): Payer: Medicare PPO | Admitting: Physician Assistant

## 2022-11-06 ENCOUNTER — Other Ambulatory Visit: Payer: Self-pay

## 2022-11-06 ENCOUNTER — Ambulatory Visit (HOSPITAL_BASED_OUTPATIENT_CLINIC_OR_DEPARTMENT_OTHER): Payer: Medicare PPO | Admitting: Anesthesiology

## 2022-11-06 ENCOUNTER — Observation Stay (HOSPITAL_COMMUNITY)
Admission: RE | Admit: 2022-11-06 | Discharge: 2022-11-07 | Disposition: A | Discharge status: Home / Self Care | Payer: Medicare PPO | Source: Ambulatory Visit | Attending: Orthopedic Surgery | Admitting: Orthopedic Surgery

## 2022-11-06 ENCOUNTER — Encounter (HOSPITAL_COMMUNITY)
Admission: RE | Disposition: A | Discharge status: Home / Self Care | Payer: Self-pay | Source: Ambulatory Visit | Attending: Orthopedic Surgery

## 2022-11-06 DIAGNOSIS — Z853 Personal history of malignant neoplasm of breast: Secondary | ICD-10-CM | POA: Diagnosis not present

## 2022-11-06 DIAGNOSIS — Z87891 Personal history of nicotine dependence: Secondary | ICD-10-CM | POA: Diagnosis not present

## 2022-11-06 DIAGNOSIS — Z85828 Personal history of other malignant neoplasm of skin: Secondary | ICD-10-CM | POA: Insufficient documentation

## 2022-11-06 DIAGNOSIS — G8918 Other acute postprocedural pain: Secondary | ICD-10-CM | POA: Diagnosis not present

## 2022-11-06 DIAGNOSIS — I10 Essential (primary) hypertension: Secondary | ICD-10-CM | POA: Diagnosis not present

## 2022-11-06 DIAGNOSIS — M179 Osteoarthritis of knee, unspecified: Principal | ICD-10-CM | POA: Diagnosis present

## 2022-11-06 DIAGNOSIS — Z96641 Presence of right artificial hip joint: Secondary | ICD-10-CM | POA: Diagnosis not present

## 2022-11-06 DIAGNOSIS — Z79899 Other long term (current) drug therapy: Secondary | ICD-10-CM | POA: Diagnosis not present

## 2022-11-06 DIAGNOSIS — M1711 Unilateral primary osteoarthritis, right knee: Secondary | ICD-10-CM

## 2022-11-06 HISTORY — PX: TOTAL KNEE ARTHROPLASTY: SHX125

## 2022-11-06 SURGERY — ARTHROPLASTY, KNEE, TOTAL
Anesthesia: Spinal | Site: Knee | Laterality: Right

## 2022-11-06 MED ORDER — TRANEXAMIC ACID-NACL 1000-0.7 MG/100ML-% IV SOLN
1000.0000 mg | INTRAVENOUS | Status: AC
  Administered 2022-11-06: 1000 mg via INTRAVENOUS
  Filled 2022-11-06: qty 100

## 2022-11-06 MED ORDER — 0.9 % SODIUM CHLORIDE (POUR BTL) OPTIME
TOPICAL | Status: DC | PRN
  Administered 2022-11-06: 1000 mL

## 2022-11-06 MED ORDER — OXYCODONE HCL 5 MG PO TABS
ORAL_TABLET | ORAL | Status: AC
  Administered 2022-11-06: 5 mg via ORAL
  Filled 2022-11-06: qty 1

## 2022-11-06 MED ORDER — DEXAMETHASONE SODIUM PHOSPHATE 10 MG/ML IJ SOLN
10.0000 mg | Freq: Once | INTRAMUSCULAR | Status: AC
  Administered 2022-11-07: 10 mg via INTRAVENOUS
  Filled 2022-11-06: qty 1

## 2022-11-06 MED ORDER — BUPIVACAINE-EPINEPHRINE (PF) 0.5% -1:200000 IJ SOLN
INTRAMUSCULAR | Status: DC | PRN
  Administered 2022-11-06: 15 mL via PERINEURAL

## 2022-11-06 MED ORDER — OLMESARTAN MEDOXOMIL-HCTZ 40-25 MG PO TABS: 1.0000 | ORAL_TABLET | Freq: Every day | ORAL | Status: DC

## 2022-11-06 MED ORDER — ONDANSETRON HCL 4 MG/2ML IJ SOLN
INTRAMUSCULAR | Status: AC
  Filled 2022-11-06: qty 2

## 2022-11-06 MED ORDER — BUPIVACAINE LIPOSOME 1.3 % IJ SUSP
INTRAMUSCULAR | Status: AC
  Filled 2022-11-06: qty 20

## 2022-11-06 MED ORDER — POVIDONE-IODINE 10 % EX SWAB: 2.0000 | Freq: Once | CUTANEOUS | Status: AC

## 2022-11-06 MED ORDER — DIPHENHYDRAMINE HCL 12.5 MG/5ML PO ELIX: 12.5000 mg | ORAL_SOLUTION | ORAL | Status: DC | PRN

## 2022-11-06 MED ORDER — FENTANYL CITRATE PF 50 MCG/ML IJ SOSY
PREFILLED_SYRINGE | INTRAMUSCULAR | Status: AC
  Administered 2022-11-06: 25 ug via INTRAVENOUS
  Filled 2022-11-06: qty 2

## 2022-11-06 MED ORDER — METOCLOPRAMIDE HCL 5 MG/ML IJ SOLN: 5.0000 mg | Freq: Three times a day (TID) | INTRAMUSCULAR | Status: DC | PRN

## 2022-11-06 MED ORDER — MIRTAZAPINE 15 MG PO TABS
15.0000 mg | ORAL_TABLET | Freq: Every day | ORAL | Status: DC
  Administered 2022-11-06: 15 mg via ORAL
  Filled 2022-11-06: qty 1

## 2022-11-06 MED ORDER — CHLORHEXIDINE GLUCONATE 0.12 % MT SOLN
15.0000 mL | Freq: Once | OROMUCOSAL | Status: AC
  Administered 2022-11-06: 15 mL via OROMUCOSAL

## 2022-11-06 MED ORDER — OXYCODONE HCL 5 MG PO TABS
5.0000 mg | ORAL_TABLET | ORAL | Status: DC | PRN
  Administered 2022-11-06 – 2022-11-07 (×3): 5 mg via ORAL
  Filled 2022-11-06: qty 2
  Filled 2022-11-06 (×2): qty 1

## 2022-11-06 MED ORDER — BUPIVACAINE IN DEXTROSE 0.75-8.25 % IT SOLN
INTRATHECAL | Status: DC | PRN
  Administered 2022-11-06: 1.6 mL via INTRATHECAL

## 2022-11-06 MED ORDER — ORAL CARE MOUTH RINSE: 15.0000 mL | OROMUCOSAL | Status: DC | PRN

## 2022-11-06 MED ORDER — RIVAROXABAN 10 MG PO TABS
10.0000 mg | ORAL_TABLET | Freq: Every day | ORAL | Status: DC
  Administered 2022-11-07: 10 mg via ORAL
  Filled 2022-11-06: qty 1

## 2022-11-06 MED ORDER — CEFAZOLIN SODIUM-DEXTROSE 2-4 GM/100ML-% IV SOLN
2.0000 g | INTRAVENOUS | Status: AC
  Administered 2022-11-06: 2 g via INTRAVENOUS
  Filled 2022-11-06: qty 100

## 2022-11-06 MED ORDER — IRBESARTAN 150 MG PO TABS
300.0000 mg | ORAL_TABLET | Freq: Every day | ORAL | Status: DC
  Administered 2022-11-06: 300 mg via ORAL
  Filled 2022-11-06: qty 2

## 2022-11-06 MED ORDER — BISACODYL 10 MG RE SUPP: 10.0000 mg | Freq: Every day | RECTAL | Status: DC | PRN

## 2022-11-06 MED ORDER — PHENOL 1.4 % MT LIQD: 1.0000 | OROMUCOSAL | Status: DC | PRN

## 2022-11-06 MED ORDER — OXYCODONE HCL 5 MG PO TABS: 5.0000 mg | ORAL_TABLET | Freq: Once | ORAL | Status: AC | PRN

## 2022-11-06 MED ORDER — ORAL CARE MOUTH RINSE: 15.0000 mL | Freq: Once | OROMUCOSAL | Status: AC

## 2022-11-06 MED ORDER — MENTHOL 3 MG MT LOZG: 1.0000 | LOZENGE | OROMUCOSAL | Status: DC | PRN

## 2022-11-06 MED ORDER — PROPOFOL 500 MG/50ML IV EMUL
INTRAVENOUS | Status: DC | PRN
  Administered 2022-11-06: 70 ug/kg/min via INTRAVENOUS

## 2022-11-06 MED ORDER — DEXAMETHASONE SODIUM PHOSPHATE 10 MG/ML IJ SOLN
INTRAMUSCULAR | Status: AC
  Filled 2022-11-06: qty 1

## 2022-11-06 MED ORDER — OXYCODONE HCL 5 MG/5ML PO SOLN: 5.0000 mg | Freq: Once | ORAL | Status: AC | PRN

## 2022-11-06 MED ORDER — BUPIVACAINE LIPOSOME 1.3 % IJ SUSP
INTRAMUSCULAR | Status: DC | PRN
  Administered 2022-11-06: 20 mL

## 2022-11-06 MED ORDER — PHENYLEPHRINE HCL-NACL 20-0.9 MG/250ML-% IV SOLN
INTRAVENOUS | Status: DC | PRN
  Administered 2022-11-06: 30 ug/min via INTRAVENOUS

## 2022-11-06 MED ORDER — ACETAMINOPHEN 10 MG/ML IV SOLN
1000.0000 mg | Freq: Four times a day (QID) | INTRAVENOUS | Status: DC
  Administered 2022-11-06: 1000 mg via INTRAVENOUS
  Filled 2022-11-06: qty 100

## 2022-11-06 MED ORDER — POLYETHYLENE GLYCOL 3350 17 G PO PACK: 17.0000 g | PACK | Freq: Every day | ORAL | Status: DC | PRN

## 2022-11-06 MED ORDER — LACTATED RINGERS IV SOLN: INTRAVENOUS | Status: DC

## 2022-11-06 MED ORDER — ONDANSETRON HCL 4 MG/2ML IJ SOLN
4.0000 mg | Freq: Four times a day (QID) | INTRAMUSCULAR | Status: DC | PRN
  Filled 2022-11-06: qty 2

## 2022-11-06 MED ORDER — CLONIDINE HCL (ANALGESIA) 100 MCG/ML EP SOLN
EPIDURAL | Status: DC | PRN
  Administered 2022-11-06: 100 ug

## 2022-11-06 MED ORDER — PROPOFOL 1000 MG/100ML IV EMUL
INTRAVENOUS | Status: AC
  Filled 2022-11-06: qty 100

## 2022-11-06 MED ORDER — HYDROCHLOROTHIAZIDE 25 MG PO TABS
25.0000 mg | ORAL_TABLET | Freq: Every day | ORAL | Status: DC
  Administered 2022-11-06: 25 mg via ORAL
  Filled 2022-11-06: qty 1

## 2022-11-06 MED ORDER — CEFAZOLIN SODIUM-DEXTROSE 2-4 GM/100ML-% IV SOLN
2.0000 g | Freq: Four times a day (QID) | INTRAVENOUS | Status: AC
  Administered 2022-11-06 – 2022-11-07 (×2): 2 g via INTRAVENOUS
  Filled 2022-11-06 (×2): qty 100

## 2022-11-06 MED ORDER — DOCUSATE SODIUM 100 MG PO CAPS
100.0000 mg | ORAL_CAPSULE | Freq: Two times a day (BID) | ORAL | Status: DC
  Administered 2022-11-06 – 2022-11-07 (×3): 100 mg via ORAL
  Filled 2022-11-06 (×3): qty 1

## 2022-11-06 MED ORDER — ONDANSETRON HCL 4 MG/2ML IJ SOLN
INTRAMUSCULAR | Status: DC | PRN
  Administered 2022-11-06: 4 mg via INTRAVENOUS

## 2022-11-06 MED ORDER — LIDOCAINE HCL (PF) 2 % IJ SOLN
INTRAMUSCULAR | Status: AC
  Filled 2022-11-06: qty 5

## 2022-11-06 MED ORDER — METHOCARBAMOL 1000 MG/10ML IJ SOLN: 500.0000 mg | Freq: Four times a day (QID) | INTRAVENOUS | Status: DC | PRN

## 2022-11-06 MED ORDER — SODIUM CHLORIDE 0.9 % IR SOLN
Status: DC | PRN
  Administered 2022-11-06: 1000 mL

## 2022-11-06 MED ORDER — FENTANYL CITRATE PF 50 MCG/ML IJ SOSY
25.0000 ug | PREFILLED_SYRINGE | INTRAMUSCULAR | Status: DC | PRN
  Administered 2022-11-06: 25 ug via INTRAVENOUS

## 2022-11-06 MED ORDER — FLEET ENEMA 7-19 GM/118ML RE ENEM: 1.0000 | ENEMA | Freq: Once | RECTAL | Status: DC | PRN

## 2022-11-06 MED ORDER — FENTANYL CITRATE PF 50 MCG/ML IJ SOSY
50.0000 ug | PREFILLED_SYRINGE | INTRAMUSCULAR | Status: AC
  Administered 2022-11-06: 50 ug via INTRAVENOUS
  Filled 2022-11-06: qty 2

## 2022-11-06 MED ORDER — METOCLOPRAMIDE HCL 5 MG PO TABS: 5.0000 mg | ORAL_TABLET | Freq: Three times a day (TID) | ORAL | Status: DC | PRN

## 2022-11-06 MED ORDER — BUPIVACAINE LIPOSOME 1.3 % IJ SUSP: 20.0000 mL | Freq: Once | INTRAMUSCULAR | Status: AC

## 2022-11-06 MED ORDER — ACETAMINOPHEN 500 MG PO TABS
1000.0000 mg | ORAL_TABLET | Freq: Four times a day (QID) | ORAL | Status: AC
  Administered 2022-11-06 – 2022-11-07 (×3): 1000 mg via ORAL
  Filled 2022-11-06 (×4): qty 2

## 2022-11-06 MED ORDER — HYDROCHLOROTHIAZIDE 25 MG PO TABS: 25.0000 mg | ORAL_TABLET | Freq: Every day | ORAL | Status: DC

## 2022-11-06 MED ORDER — STERILE WATER FOR IRRIGATION IR SOLN
Status: DC | PRN
  Administered 2022-11-06: 2000 mL

## 2022-11-06 MED ORDER — ROSUVASTATIN CALCIUM 10 MG PO TABS
10.0000 mg | ORAL_TABLET | Freq: Every evening | ORAL | Status: DC
  Administered 2022-11-06: 10 mg via ORAL
  Filled 2022-11-06: qty 1

## 2022-11-06 MED ORDER — SODIUM CHLORIDE (PF) 0.9 % IJ SOLN
INTRAMUSCULAR | Status: DC | PRN
  Administered 2022-11-06: 60 mL

## 2022-11-06 MED ORDER — ONDANSETRON HCL 4 MG PO TABS: 4.0000 mg | ORAL_TABLET | Freq: Four times a day (QID) | ORAL | Status: DC | PRN

## 2022-11-06 MED ORDER — LIDOCAINE HCL (PF) 2 % IJ SOLN
INTRAMUSCULAR | Status: DC | PRN
  Administered 2022-11-06: 50 mg via INTRADERMAL

## 2022-11-06 MED ORDER — PHENYLEPHRINE HCL-NACL 20-0.9 MG/250ML-% IV SOLN
INTRAVENOUS | Status: AC
  Filled 2022-11-06: qty 250

## 2022-11-06 MED ORDER — METHOCARBAMOL 500 MG PO TABS
500.0000 mg | ORAL_TABLET | Freq: Four times a day (QID) | ORAL | Status: DC | PRN
  Administered 2022-11-06: 500 mg via ORAL
  Filled 2022-11-06: qty 1

## 2022-11-06 MED ORDER — SODIUM CHLORIDE 0.9 % IV SOLN: INTRAVENOUS | Status: DC

## 2022-11-06 MED ORDER — IRBESARTAN 150 MG PO TABS: 300.0000 mg | ORAL_TABLET | Freq: Every day | ORAL | Status: DC

## 2022-11-06 MED ORDER — TRAMADOL HCL 50 MG PO TABS
50.0000 mg | ORAL_TABLET | Freq: Four times a day (QID) | ORAL | Status: DC | PRN
  Administered 2022-11-06 – 2022-11-07 (×2): 100 mg via ORAL
  Filled 2022-11-06 (×2): qty 2

## 2022-11-06 MED ORDER — MORPHINE SULFATE (PF) 2 MG/ML IV SOLN: 1.0000 mg | INTRAVENOUS | Status: DC | PRN

## 2022-11-06 MED ORDER — DEXAMETHASONE SODIUM PHOSPHATE 10 MG/ML IJ SOLN
8.0000 mg | Freq: Once | INTRAMUSCULAR | Status: AC
  Administered 2022-11-06: 8 mg via INTRAVENOUS

## 2022-11-06 SURGICAL SUPPLY — 59 items
ADH SKN CLS APL DERMABOND .7 (GAUZE/BANDAGES/DRESSINGS) ×1
ADH SKN CLS LQ APL DERMABOND (GAUZE/BANDAGES/DRESSINGS) ×1
ATTUNE MED DOME PAT 38 KNEE (Knees) IMPLANT
ATTUNE PS FEM RT SZ 6 CEM KNEE (Femur) IMPLANT
ATTUNE PSRP INSR SZ6 8 KNEE (Insert) IMPLANT
BAG COUNTER SPONGE SURGICOUNT (BAG) IMPLANT
BAG SPEC THK2 15X12 ZIP CLS (MISCELLANEOUS) ×1
BAG SPNG CNTER NS LX DISP (BAG)
BAG ZIPLOCK 12X15 (MISCELLANEOUS) ×1 IMPLANT
BASE TIBIA ATTUNE KNEE SYS SZ6 (Knees) IMPLANT
BLADE SAG 18X100X1.27 (BLADE) ×1 IMPLANT
BLADE SAW SGTL 11.0X1.19X90.0M (BLADE) ×1 IMPLANT
BNDG CMPR 6 X 5 YARDS HK CLSR (GAUZE/BANDAGES/DRESSINGS) ×1
BNDG CMPR MED 10X6 ELC LF (GAUZE/BANDAGES/DRESSINGS) ×1
BNDG ELASTIC 6INX 5YD STR LF (GAUZE/BANDAGES/DRESSINGS) ×1 IMPLANT
BNDG ELASTIC 6X10 VLCR STRL LF (GAUZE/BANDAGES/DRESSINGS) IMPLANT
BOWL SMART MIX CTS (DISPOSABLE) ×1 IMPLANT
BSPLAT TIB 6 CMNT ROT PLAT STR (Knees) ×1 IMPLANT
CEMENT HV SMART SET (Cement) ×2 IMPLANT
COVER SURGICAL LIGHT HANDLE (MISCELLANEOUS) ×1 IMPLANT
CUFF TOURN SGL QUICK 34 (TOURNIQUET CUFF) ×1
CUFF TRNQT CYL 34X4.125X (TOURNIQUET CUFF) ×1 IMPLANT
DERMABOND ADVANCED .7 DNX12 (GAUZE/BANDAGES/DRESSINGS) ×1 IMPLANT
DERMABOND ADVANCED .7 DNX6 (GAUZE/BANDAGES/DRESSINGS) IMPLANT
DRAPE INCISE IOBAN 66X45 STRL (DRAPES) ×1 IMPLANT
DRAPE U-SHAPE 47X51 STRL (DRAPES) ×1 IMPLANT
DRSG AQUACEL AG ADV 3.5X10 (GAUZE/BANDAGES/DRESSINGS) ×1 IMPLANT
DURAPREP 26ML APPLICATOR (WOUND CARE) ×1 IMPLANT
ELECT REM PT RETURN 15FT ADLT (MISCELLANEOUS) ×1 IMPLANT
GLOVE BIO SURGEON STRL SZ 6.5 (GLOVE) IMPLANT
GLOVE BIO SURGEON STRL SZ8 (GLOVE) ×1 IMPLANT
GLOVE BIOGEL PI IND STRL 6.5 (GLOVE) IMPLANT
GLOVE BIOGEL PI IND STRL 7.0 (GLOVE) IMPLANT
GLOVE BIOGEL PI IND STRL 8 (GLOVE) ×1 IMPLANT
GOWN STRL REUS W/ TWL LRG LVL3 (GOWN DISPOSABLE) ×1 IMPLANT
GOWN STRL REUS W/TWL LRG LVL3 (GOWN DISPOSABLE) ×1
HANDPIECE INTERPULSE COAX TIP (DISPOSABLE) ×1
HOLDER FOLEY CATH W/STRAP (MISCELLANEOUS) IMPLANT
IMMOBILIZER KNEE 20 (SOFTGOODS) ×1
IMMOBILIZER KNEE 20 THIGH 36 (SOFTGOODS) ×1 IMPLANT
KIT TURNOVER KIT A (KITS) IMPLANT
MANIFOLD NEPTUNE II (INSTRUMENTS) ×1 IMPLANT
NS IRRIG 1000ML POUR BTL (IV SOLUTION) ×1 IMPLANT
PACK TOTAL KNEE CUSTOM (KITS) ×1 IMPLANT
PADDING CAST COTTON 6X4 STRL (CAST SUPPLIES) ×2 IMPLANT
PIN STEINMAN FIXATION KNEE (PIN) IMPLANT
PROTECTOR NERVE ULNAR (MISCELLANEOUS) ×1 IMPLANT
SET HNDPC FAN SPRY TIP SCT (DISPOSABLE) ×1 IMPLANT
SPIKE FLUID TRANSFER (MISCELLANEOUS) ×1 IMPLANT
SUT MNCRL AB 4-0 PS2 18 (SUTURE) ×1 IMPLANT
SUT STRATAFIX 0 PDS 27 VIOLET (SUTURE) ×1
SUT VIC AB 2-0 CT1 27 (SUTURE) ×3
SUT VIC AB 2-0 CT1 TAPERPNT 27 (SUTURE) ×3 IMPLANT
SUTURE STRATFX 0 PDS 27 VIOLET (SUTURE) ×1 IMPLANT
TIBIA ATTUNE KNEE SYS BASE SZ6 (Knees) ×1 IMPLANT
TRAY FOLEY MTR SLVR 16FR STAT (SET/KITS/TRAYS/PACK) ×1 IMPLANT
TUBE SUCTION HIGH CAP CLEAR NV (SUCTIONS) ×1 IMPLANT
WATER STERILE IRR 1000ML POUR (IV SOLUTION) ×2 IMPLANT
WRAP KNEE MAXI GEL POST OP (GAUZE/BANDAGES/DRESSINGS) ×1 IMPLANT

## 2022-11-06 NOTE — Anesthesia Postprocedure Evaluation (Signed)
Anesthesia Post Note  Patient: Andrea Vasquez  Procedure(s) Performed: TOTAL KNEE ARTHROPLASTY (Right: Knee)     Patient location during evaluation: PACU Anesthesia Type: Spinal Level of consciousness: awake and alert Pain management: pain level controlled Vital Signs Assessment: post-procedure vital signs reviewed and stable Respiratory status: spontaneous breathing, nonlabored ventilation and respiratory function stable Cardiovascular status: blood pressure returned to baseline Postop Assessment: no apparent nausea or vomiting, spinal receding, no headache and no backache Anesthetic complications: no   No notable events documented.  Last Vitals:  Vitals:   11/06/22 1215 11/06/22 1245  BP: (!) 156/80 138/84  Pulse: (!) 51 (!) 52  Resp: 16 15  Temp:    SpO2: 99% 97%    Last Pain:  Vitals:   11/06/22 1339  TempSrc:   PainSc: 3                  Shanda Howells

## 2022-11-06 NOTE — Care Plan (Signed)
Ortho Bundle Case Management Note  Patient Details  Name: Andrea Vasquez MRN: [id number] Date of Birth: 11/21/1935  R TKA on 11-06-22 DCP:  Home with dtr DME:  No needs, has a RW PT:  [facility] on 11-09-22                   DME Arranged:  N/A DME Agency:  NA  HH Arranged:  NA HH Agency:  NA  Additional Comments: Please contact me with any questions of if this plan should need to change.  Ennis Forts, RN,CCM [facility]  [phone] 11/06/2022, 9:51 AM

## 2022-11-06 NOTE — Evaluation (Signed)
Physical Therapy Evaluation Patient Details Name: Andrea Vasquez MRN: [id number] DOB: October 07, 1935 Today's Date: 11/06/2022  History of Present Illness  87 yo female presents to therapy s/p R TKA on 11/06/2022 due to failure of conservative measures. Pt PMH includes but is not limited to: GI bleed, anemia, HLD, HTN, hypokalemia, R ba ca, dysrhythmia, goiter, back surgery, and R THA.  Clinical Impression    Andrea Vasquez is a 87 y.o. female POD 0 s/p R TKA. Patient reports mod I with mobility at baseline. Patient is now limited by functional impairments (see PT problem list below) and requires min guard for bed mobility and mod A and cues for transfers. Patient was able to ambulate 20 feet with RW and min guard level of assist pt indicated feeling a little queasy with gait. Patient instructed in exercise to facilitate ROM and circulation to manage edema.  Patient will benefit from continued skilled PT interventions to address impairments and progress towards PLOF. Acute PT will follow to progress mobility and stair training in preparation for safe discharge home with family support and OPPT services starting 8/1.       If plan is discharge home, recommend the following: A little help with walking and/or transfers;A little help with bathing/dressing/bathroom;Assistance with cooking/housework;Assist for transportation;Help with stairs or ramp for entrance   Can travel by private vehicle        Equipment Recommendations None recommended by PT  Recommendations for Other Services       Functional Status Assessment Patient has had a recent decline in their functional status and demonstrates the ability to make significant improvements in function in a reasonable and predictable amount of time.     Precautions / Restrictions Precautions Precautions: Knee;Fall Restrictions Weight Bearing Restrictions: No      Mobility  Bed Mobility Overal bed mobility: Needs Assistance Bed  Mobility: Supine to Sit     Supine to sit: Min guard, HOB elevated     General bed mobility comments: cues for use of LLE to assist to scoot R LE to EOB, increased time    Transfers Overall transfer level: Needs assistance Equipment used: Rolling walker (2 wheels) Transfers: Sit to/from Stand Sit to Stand: Mod assist, From elevated surface           General transfer comment: cues for proper UE placement and mod A to complete power up and cues for trunk extension    Ambulation/Gait Ambulation/Gait assistance: Min guard Gait Distance (Feet): 20 Feet Assistive device: Rolling walker (2 wheels) Gait Pattern/deviations: Step-to pattern, Antalgic, Trunk flexed Gait velocity: decreased     General Gait Details: step to with pt sliding LLE forward  Stairs            Wheelchair Mobility     Tilt Bed    Modified Rankin (Stroke Patients Only)       Balance Overall balance assessment: Needs assistance Sitting-balance support: Feet supported Sitting balance-Leahy Scale: Good     Standing balance support: Bilateral upper extremity supported, During functional activity, Reliant on assistive device for balance Standing balance-Leahy Scale: Poor                               Pertinent Vitals/Pain Pain Assessment Pain Assessment: 0-10 Pain Score: 3  Pain Location: R knee Pain Descriptors / Indicators: Aching, Discomfort, Operative site guarding Pain Intervention(s): Limited activity within patient's tolerance, Monitored during session, Premedicated before session, Repositioned, Ice applied  Home Living Family/patient expects to be discharged to:: Private residence Living Arrangements: Alone Available Help at Discharge: Family Type of Home: House Home Access: Stairs to enter Entrance Stairs-Rails: None Entrance Stairs-Number of Steps: 1   Home Layout: One level Home Equipment: Crutches;Cane - single point;Rolling Walker (2 wheels);Shower seat       Prior Function Prior Level of Function : Independent/Modified Independent;Driving             Mobility Comments: intermittent use of SPC for community distances and at night time, mod I for all ADLs, self care tasks and IADLs       Hand Dominance        Extremity/Trunk Assessment        Lower Extremity Assessment Lower Extremity Assessment: RLE deficits/detail RLE Deficits / Details: ankle DF/PF 5/5; AA for SLR RLE Sensation: WNL    Cervical / Trunk Assessment Cervical / Trunk Assessment:  (wfl)  Communication   Communication: No difficulties  Cognition Arousal/Alertness: Awake/alert Behavior During Therapy: WFL for tasks assessed/performed Overall Cognitive Status: Within Functional Limits for tasks assessed                                          General Comments      Exercises Total Joint Exercises Ankle Circles/Pumps: AROM, Both, 20 reps   Assessment/Plan    PT Assessment Patient needs continued PT services  PT Problem List Decreased strength;Decreased range of motion;Decreased activity tolerance;Decreased mobility;Decreased balance;Decreased coordination;Pain       PT Treatment Interventions DME instruction;Gait training;Stair training;Functional mobility training;Therapeutic activities;Therapeutic exercise;Balance training;Neuromuscular re-education;Patient/family education;Modalities    PT Goals (Current goals can be found in the Care Plan section)  Acute Rehab PT Goals Patient Stated Goal: to be able to go for long walks in the fall PT Goal Formulation: With patient Time For Goal Achievement: 11/27/22 Potential to Achieve Goals: Good    Frequency 7X/week     Co-evaluation               AM-PAC PT "6 Clicks" Mobility  Outcome Measure Help needed turning from your back to your side while in a flat bed without using bedrails?: A Little Help needed moving from lying on your back to sitting on the side of a flat bed  without using bedrails?: A Little Help needed moving to and from a bed to a chair (including a wheelchair)?: A Little Help needed standing up from a chair using your arms (e.g., wheelchair or bedside chair)?: A Little Help needed to walk in hospital room?: A Little Help needed climbing 3-5 steps with a railing? : A Lot 6 Click Score: 17    End of Session Equipment Utilized During Treatment: Gait belt Activity Tolerance: Patient limited by fatigue Patient left: in chair;with call bell/phone within reach;with chair alarm set Nurse Communication: Mobility status PT Visit Diagnosis: Unsteadiness on feet (R26.81);Other abnormalities of gait and mobility (R26.89);Muscle weakness (generalized) (M62.81);Pain Pain - Right/Left: Right Pain - part of body: Knee;Leg    Time: 1700-1727 PT Time Calculation (min) (ACUTE ONLY): 27 min   Charges:   PT Evaluation $PT Eval Low Complexity: 1 Low PT Treatments $Gait Training: 8-22 mins PT General Charges $$ ACUTE PT VISIT: 1 Visit          Johnny Bridge, PT Acute Rehab   Jacqualyn Posey 11/06/2022, 6:42 PM

## 2022-11-06 NOTE — Progress Notes (Signed)
Orthopedic Tech Progress Note Patient Details:  [place] [birth year] [phone]  CPM Right Knee CPM Right Knee: Off Right Knee Flexion (Degrees): 10 Right Knee Extension (Degrees): 40  Post Interventions Patient Tolerated: Well Instructions Provided: Adjustment of device  Andrea Vasquez 11/06/2022, 3:31 PM

## 2022-11-06 NOTE — Interval H&P Note (Signed)
History and Physical Interval Note:  11/06/2022 7:01 AM  [name]  has presented today for surgery, with the diagnosis of right knee osteoarthritis.  The various methods of treatment have been discussed with the patient and family. After consideration of risks, benefits and other options for treatment, the patient has consented to  Procedure(s): TOTAL KNEE ARTHROPLASTY (Right) as a surgical intervention.  The patient's history has been reviewed, patient examined, no change in status, stable for surgery.  I have reviewed the patient's chart and labs.  Questions were answered to the patient's satisfaction.     Andrea Vasquez Marguerette Sheller

## 2022-11-06 NOTE — Op Note (Signed)
OPERATIVE REPORT-TOTAL KNEE ARTHROPLASTY   Pre-operative diagnosis- Osteoarthritis  Right knee(s)  Post-operative diagnosis- Osteoarthritis Right knee(s)  Procedure-  Right  Total Knee Arthroplasty  Surgeon- Gus Rankin. Finas Delone, MD  Assistant- Rene Paci, PA-C   Anesthesia-   Adductor canal block and spinal  EBL-50 mL   Drains None  Tourniquet time-  Total Tourniquet Time Documented: Thigh (Right) - 31 minutes Total: Thigh (Right) - 31 minutes     Complications- None  Condition-PACU - hemodynamically stable.   Brief Clinical Note  Andrea Vasquez is a 87 y.o. year old female with end stage OA of her right knee with progressively worsening pain and dysfunction. She has constant pain, with activity and at rest and significant functional deficits with difficulties even with ADLs. She has had extensive non-op management including analgesics, injections of cortisone and viscosupplements, and home exercise program, but remains in significant pain with significant dysfunction.Radiographs show bone on bone arthritis medial and patellofemoral. She presents now for right Total Knee Arthroplasty.     Procedure in detail---   The patient is brought into the operating room and positioned supine on the operating table. After successful administration of  Adductor canal block and spinal,   a tourniquet is placed high on the  Right thigh(s) and the lower extremity is prepped and draped in the usual sterile fashion. Time out is performed by the operating team and then the  Right lower extremity is wrapped in Esmarch, knee flexed and the tourniquet inflated to 300 mmHg.       A midline incision is made with a ten blade through the subcutaneous tissue to the level of the extensor mechanism. A fresh blade is used to make a medial parapatellar arthrotomy. Soft tissue over the proximal medial tibia is subperiosteally elevated to the joint line with a knife and into the semimembranosus bursa with a  Cobb elevator. Soft tissue over the proximal lateral tibia is elevated with attention being paid to avoiding the patellar tendon on the tibial tubercle. The patella is everted, knee flexed 90 degrees and the ACL and PCL are removed. Findings are bone on bone all 3 compartments with large global osteophytes        The drill is used to create a starting hole in the distal femur and the canal is thoroughly irrigated with sterile saline to remove the fatty contents. The 5 degree Right  valgus alignment guide is placed into the femoral canal and the distal femoral cutting block is pinned to remove 9 mm off the distal femur. Resection is made with an oscillating saw.      The tibia is subluxed forward and the menisci are removed. The extramedullary alignment guide is placed referencing proximally at the medial aspect of the tibial tubercle and distally along the second metatarsal axis and tibial crest. The block is pinned to remove 2mm off the more deficient medial  side. Resection is made with an oscillating saw. Size 6is the most appropriate size for the tibia and the proximal tibia is prepared with the modular drill and keel punch for that size.      The femoral sizing guide is placed and size 6 is most appropriate. Rotation is marked off the epicondylar axis and confirmed by creating a rectangular flexion gap at 90 degrees. The size 6 cutting block is pinned in this rotation and the anterior, posterior and chamfer cuts are made with the oscillating saw. The intercondylar block is then placed and that cut is made.  Trial size 6 tibial component, trial size 6 posterior stabilized femur and a 8  mm posterior stabilized rotating platform insert trial is placed. Full extension is achieved with excellent varus/valgus and anterior/posterior balance throughout full range of motion. The patella is everted and thickness measured to be 22  mm. Free hand resection is taken to 12 mm, a 38 template is placed, lug holes are  drilled, trial patella is placed, and it tracks normally. Osteophytes are removed off the posterior femur with the trial in place. All trials are removed and the cut bone surfaces prepared with pulsatile lavage. Cement is mixed and once ready for implantation, the size 6 tibial implant, size  6 posterior stabilized femoral component, and the size 38 patella are cemented in place and the patella is held with the clamp. The trial insert is placed and the knee held in full extension. The Exparel (20 ml mixed with 60 ml saline) is injected into the extensor mechanism, posterior capsule, medial and lateral gutters and subcutaneous tissues.  All extruded cement is removed and once the cement is hard the permanent 8 mm posterior stabilized rotating platform insert is placed into the tibial tray.      The wound is copiously irrigated with saline solution and the extensor mechanism closed with # 0 Stratofix suture. The tourniquet is released for a total tourniquet time of 32  minutes. Flexion against gravity is 140 degrees and the patella tracks normally. Subcutaneous tissue is closed with 2.0 vicryl and subcuticular with running 4.0 Monocryl. The incision is cleaned and dried and steri-strips and a bulky sterile dressing are applied. The limb is placed into a knee immobilizer and the patient is awakened and transported to recovery in stable condition.      Please note that a surgical assistant was a medical necessity for this procedure in order to perform it in a safe and expeditious manner. Surgical assistant was necessary to retract the ligaments and vital neurovascular structures to prevent injury to them and also necessary for proper positioning of the limb to allow for anatomic placement of the prosthesis.   Gus Rankin Amori Colomb, MD    11/06/2022, 10:55 AM

## 2022-11-06 NOTE — Transfer of Care (Signed)
Immediate Anesthesia Transfer of Care Note  Patient: Andrea Vasquez  Procedure(s) Performed: TOTAL KNEE ARTHROPLASTY (Right: Knee)  Patient Location: PACU  Anesthesia Type:Spinal  Level of Consciousness: awake, alert , oriented, and patient cooperative  Airway & Oxygen Therapy: Patient Spontanous Breathing and Patient connected to face mask oxygen  Post-op Assessment: Report given to RN and Post -op Vital signs reviewed and stable  Post vital signs: Reviewed and stable  Last Vitals:  Vitals Value Taken Time  BP 135/81 11/06/22 1120  Temp    Pulse 68 11/06/22 1123  Resp 10 11/06/22 1123  SpO2 100 % 11/06/22 1123  Vitals shown include unfiled device data.  Last Pain:  Vitals:   11/06/22 0737  TempSrc: Oral  PainSc:       Patients Stated Pain Goal: 3 (11/06/22 0715)  Complications: No notable events documented.

## 2022-11-06 NOTE — Progress Notes (Signed)
Orthopedic Tech Progress Note Patient Details:  Andrea Vasquez Aug 16, 1935 [id number] CPM will be removed at 3:30 pm.  CPM Right Knee CPM Right Knee: On Right Knee Flexion (Degrees): 10 Right Knee Extension (Degrees): 40  Post Interventions Patient Tolerated: Well Instructions Provided: Adjustment of device  Ej Pinson E Darl Kuss 11/06/2022, 11:36 AM

## 2022-11-06 NOTE — Anesthesia Procedure Notes (Signed)
Spinal  Patient location during procedure: OR Start time: 11/06/2022 9:45 AM End time: 11/06/2022 9:53 AM Reason for block: surgical anesthesia Staffing Performed: anesthesiologist  Anesthesiologist: Kaylyn Layer, MD Performed by: Kaylyn Layer, MD Authorized by: Kaylyn Layer, MD   Preanesthetic Checklist Completed: patient identified, IV checked, risks and benefits discussed, surgical consent, monitors and equipment checked, pre-op evaluation and timeout performed Spinal Block Patient position: sitting Prep: DuraPrep and site prepped and draped Patient monitoring: continuous pulse ox, blood pressure and heart rate Approach: midline Location: L3-4 Injection technique: single-shot Needle Needle type: Whitacre  Needle gauge: 22 G Needle length: 9 cm Assessment Events: CSF return Additional Notes Risks, benefits, and alternative discussed. Patient gave consent to procedure. Prepped and draped in sitting position. Patient sedated but responsive to voice. Difficult placement with 4 attempts required (previous lumbar fusion). Clear CSF obtained. Positive terminal aspiration. No pain or paraesthesias with injection. Patient tolerated procedure well. Vital signs stable. Amalia Greenhouse, MD

## 2022-11-06 NOTE — Anesthesia Procedure Notes (Signed)
Anesthesia Regional Block: Adductor canal block   Pre-Anesthetic Checklist: , timeout performed,  Correct Patient, Correct Site, Correct Laterality,  Correct Procedure, Correct Position, site marked,  Risks and benefits discussed,  Pre-op evaluation,  At surgeon's request and post-op pain management  Laterality: Right  Prep: Maximum Sterile Barrier Precautions used, chloraprep       Needles:  Injection technique: Single-shot  Needle Type: Echogenic Stimulator Needle     Needle Length: 9cm  Needle Gauge: 22     Additional Needles:   Procedures:,,,, ultrasound used (permanent image in chart),,    Narrative:  Start time: 11/06/2022 8:50 AM End time: 11/06/2022 8:53 AM Injection made incrementally with aspirations every 5 mL.  Performed by: Personally  Anesthesiologist: Kaylyn Layer, MD  Additional Notes: Risks, benefits, and alternative discussed. Patient gave consent for procedure. Patient prepped and draped in sterile fashion. Sedation administered, patient remains easily responsive to voice. Relevant anatomy identified with ultrasound guidance. Local anesthetic given in 5cc increments with no signs or symptoms of intravascular injection. No pain or paraesthesias with injection. Patient monitored throughout procedure with signs of LAST or immediate complications. Tolerated well. Ultrasound image placed in chart.  Amalia Greenhouse, MD

## 2022-11-06 NOTE — Discharge Instructions (Signed)
 Frank Aluisio, MD Total Joint Specialist [facility] [address] [place], [place] [address] [phone]  TOTAL KNEE REPLACEMENT POSTOPERATIVE DIRECTIONS    Knee Rehabilitation, Guidelines Following Surgery  Results after knee surgery are often greatly improved when you follow the exercise, range of motion and muscle strengthening exercises prescribed by your doctor. Safety measures are also important to protect the knee from further injury. If any of these exercises cause you to have increased pain or swelling in your knee joint, decrease the amount until you are comfortable again and slowly increase them. If you have problems or questions, call your caregiver or physical therapist for advice.   BLOOD CLOT PREVENTION Take a 10 mg Xarelto once a day for three weeks following surgery.  You may resume your vitamins/supplements once you have discontinued the Xarelto. Do not take any NSAIDs (Advil, Aleve, Ibuprofen, Meloxicam, etc.) until you have discontinued the Xarelto.   HOME CARE INSTRUCTIONS  Remove items at home which could result in a fall. This includes throw rugs or furniture in walking pathways.  ICE to the affected knee as much as tolerated. Icing helps control swelling. If the swelling is well controlled you will be more comfortable and rehab easier. Continue to use ice on the knee for pain and swelling from surgery. You may notice swelling that will progress down to the foot and ankle. This is normal after surgery. Elevate the leg when you are not up walking on it.    Continue to use the breathing machine which will help keep your temperature down. It is common for your temperature to cycle up and down following surgery, especially at night when you are not up moving around and exerting yourself. The breathing machine keeps your lungs expanded and your temperature down. Do not place pillow under the operative knee, focus on keeping the knee straight  while resting  DIET You may resume your previous home diet once you are discharged from the hospital.  DRESSING / WOUND CARE / SHOWERING Keep your bulky bandage on for 2 days. On the third post-operative day you may remove the Ace bandage and gauze. There is a waterproof adhesive bandage on your skin which will stay in place until your first follow-up appointment. Once you remove this you will not need to place another bandage You may begin showering 3 days following surgery, but do not submerge the incision under water.  ACTIVITY For the first 5 days, the key is rest and control of pain and swelling Do your home exercises twice a day starting on post-operative day 3. On the days you go to physical therapy, just do the home exercises once that day. You should rest, ice and elevate the leg for 50 minutes out of every hour. Get up and walk/stretch for 10 minutes per hour. After 5 days you can increase your activity slowly as tolerated. Walk with your Nagy as instructed. Use the Zilberman until you are comfortable transitioning to a cane. Walk with the cane in the opposite hand of the operative leg. You may discontinue the cane once you are comfortable and walking steadily. Avoid periods of inactivity such as sitting longer than an hour when not asleep. This helps prevent blood clots.  You may discontinue the knee immobilizer once you are able to perform a straight leg raise while lying down. You may resume a sexual relationship in one month or when given the OK by your doctor.  You may return to work once   you are cleared by your doctor.  Do not drive a car for 6 weeks or until released by your surgeon.  Do not drive while taking narcotics.  TED HOSE STOCKINGS Wear the elastic stockings on both legs for three weeks following surgery during the day. You may remove them at night for sleeping.  WEIGHT BEARING Weight bearing as tolerated with assist device (Yankey, cane, etc) as directed, use it as  long as suggested by your surgeon or therapist, typically at least 4-6 weeks.  POSTOPERATIVE CONSTIPATION PROTOCOL Constipation - defined medically as fewer than three stools per week and severe constipation as less than one stool per week.  One of the most common issues patients have following surgery is constipation.  Even if you have a regular bowel pattern at home, your normal regimen is likely to be disrupted due to multiple reasons following surgery.  Combination of anesthesia, postoperative narcotics, change in appetite and fluid intake all can affect your bowels.  In order to avoid complications following surgery, here are some recommendations in order to help you during your recovery period.  Colace (docusate) - Pick up an over-the-counter form of Colace or another stool softener and take twice a day as long as you are requiring postoperative pain medications.  Take with a full glass of water daily.  If you experience loose stools or diarrhea, hold the colace until you stool forms back up. If your symptoms do not get better within 1 week or if they get worse, check with your doctor. Dulcolax (bisacodyl) - Pick up over-the-counter and take as directed by the product packaging as needed to assist with the movement of your bowels.  Take with a full glass of water.  Use this product as needed if not relieved by Colace only.  MiraLax (polyethylene glycol) - Pick up over-the-counter to have on hand. MiraLax is a solution that will increase the amount of water in your bowels to assist with bowel movements.  Take as directed and can mix with a glass of water, juice, soda, coffee, or tea. Take if you go more than two days without a movement. Do not use MiraLax more than once per day. Call your doctor if you are still constipated or irregular after using this medication for 7 days in a row.  If you continue to have problems with postoperative constipation, please contact the office for further assistance  and recommendations.  If you experience "the worst abdominal pain ever" or develop nausea or vomiting, please contact the office immediatly for further recommendations for treatment.  ITCHING If you experience itching with your medications, try taking only a single pain pill, or even half a pain pill at a time.  You can also use Benadryl over the counter for itching or also to help with sleep.   MEDICATIONS See your medication summary on the "After Visit Summary" that the nursing staff will review with you prior to discharge.  You may have some home medications which will be placed on hold until you complete the course of blood thinner medication.  It is important for you to complete the blood thinner medication as prescribed by your surgeon.  Continue your approved medications as instructed at time of discharge.  PRECAUTIONS If you experience chest pain or shortness of breath - call 911 immediately for transfer to the hospital emergency department.  If you develop a fever greater that 101 F, purulent drainage from wound, increased redness or drainage from wound, foul odor from the wound/dressing,   or calf pain - CONTACT YOUR SURGEON.                                                   FOLLOW-UP APPOINTMENTS Make sure you keep all of your appointments after your operation with your surgeon and caregivers. You should call the office at the above phone number and make an appointment for approximately two weeks after the date of your surgery or on the date instructed by your surgeon outlined in the "After Visit Summary".  RANGE OF MOTION AND STRENGTHENING EXERCISES  Rehabilitation of the knee is important following a knee injury or an operation. After just a few days of immobilization, the muscles of the thigh which control the knee become weakened and shrink (atrophy). Knee exercises are designed to build up the tone and strength of the thigh muscles and to improve knee motion. Often times heat used for  twenty to thirty minutes before working out will loosen up your tissues and help with improving the range of motion but do not use heat for the first two weeks following surgery. These exercises can be done on a training (exercise) mat, on the floor, on a table or on a bed. Use what ever works the best and is most comfortable for you Knee exercises include:  Leg Lifts - While your knee is still immobilized in a splint or cast, you can do straight leg raises. Lift the leg to 60 degrees, hold for 3 sec, and slowly lower the leg. Repeat 10-20 times 2-3 times daily. Perform this exercise against resistance later as your knee gets better.  Quad and Hamstring Sets - Tighten up the muscle on the front of the thigh (Quad) and hold for 5-10 sec. Repeat this 10-20 times hourly. Hamstring sets are done by pushing the foot backward against an object and holding for 5-10 sec. Repeat as with quad sets.  Leg Slides: Lying on your back, slowly slide your foot toward your buttocks, bending your knee up off the floor (only go as far as is comfortable). Then slowly slide your foot back down until your leg is flat on the floor again. Angel Wings: Lying on your back spread your legs to the side as far apart as you can without causing discomfort.  A rehabilitation program following serious knee injuries can speed recovery and prevent re-injury in the future due to weakened muscles. Contact your doctor or a physical therapist for more information on knee rehabilitation.   POST-OPERATIVE OPIOID TAPER INSTRUCTIONS: It is important to wean off of your opioid medication as soon as possible. If you do not need pain medication after your surgery it is ok to stop day one. Opioids include: Codeine, Hydrocodone(Norco, Vicodin), Oxycodone(Percocet, oxycontin) and hydromorphone amongst others.  Long term and even short term use of opiods can cause: Increased pain response Dependence Constipation Depression Respiratory depression And  more.  Withdrawal symptoms can include Flu like symptoms Nausea, vomiting And more Techniques to manage these symptoms Hydrate well Eat regular healthy meals Stay active Use relaxation techniques(deep breathing, meditating, yoga) Do Not substitute Alcohol to help with tapering If you have been on opioids for less than two weeks and do not have pain than it is ok to stop all together.  Plan to wean off of opioids This plan should start within one week post op of   your joint replacement. Maintain the same interval or time between taking each dose and first decrease the dose.  Cut the total daily intake of opioids by one tablet each day Next start to increase the time between doses. The last dose that should be eliminated is the evening dose.   IF YOU ARE TRANSFERRED TO A SKILLED REHAB FACILITY If the patient is transferred to a skilled rehab facility following release from the hospital, a list of the current medications will be sent to the facility for the patient to continue.  When discharged from the skilled rehab facility, please have the facility set up the patient's Home Health Physical Therapy prior to being released. Also, the skilled facility will be responsible for providing the patient with their medications at time of release from the facility to include their pain medication, the muscle relaxants, and their blood thinner medication. If the patient is still at the rehab facility at time of the two week follow up appointment, the skilled rehab facility will also need to assist the patient in arranging follow up appointment in our office and any transportation needs.  MAKE SURE YOU:  Understand these instructions.  Get help right away if you are not doing well or get worse.   DENTAL ANTIBIOTICS:  In most cases prophylactic antibiotics for Dental procdeures after total joint surgery are not necessary.  Exceptions are as follows:  1. History of prior total joint infection  2.  Severely immunocompromised (Organ Transplant, cancer chemotherapy, Rheumatoid biologic meds such as Humera)  3. Poorly controlled diabetes (A1C &gt; 8.0, blood glucose over 200)  If you have one of these conditions, contact your surgeon for an antibiotic prescription, prior to your dental procedure.    Pick up stool softner and laxative for home use following surgery while on pain medications. Do not submerge incision under water. Please use good hand washing techniques while changing dressing each day. May shower starting three days after surgery. Please use a clean towel to pat the incision dry following showers. Continue to use ice for pain and swelling after surgery. Do not use any lotions or creams on the incision until instructed by your surgeon.   +++++++++++++++++++++++++++++++++++++++++++++++++++++++++++++++++++++++++++++  Information on my medicine - XARELTO (Rivaroxaban)  Why was Xarelto prescribed for you? Xarelto was prescribed for you to reduce the risk of blood clots forming after orthopedic surgery. The medical term for these abnormal blood clots is venous thromboembolism (VTE).  What do you need to know about xarelto ? Take your Xarelto ONCE DAILY at the same time every day. You may take it either with or without food.  If you have difficulty swallowing the tablet whole, you may crush it and mix in applesauce just prior to taking your dose.  Take Xarelto exactly as prescribed by your doctor and DO NOT stop taking Xarelto without talking to the doctor who prescribed the medication.  Stopping without other VTE prevention medication to take the place of Xarelto may increase your risk of developing a clot.  After discharge, you should have regular check-up appointments with your healthcare provider that is prescribing your Xarelto.    What do you do if you miss a dose? If you miss a dose, take it as soon as you remember on the same day then continue your  regularly scheduled once daily regimen the next day. Do not take two doses of Xarelto on the same day.   Important Safety Information A possible side effect of Xarelto is bleeding. You   should call your healthcare provider right away if you experience any of the following: Bleeding from an injury or your nose that does not stop. Unusual colored urine (red or dark brown) or unusual colored stools (red or black). Unusual bruising for unknown reasons. A serious fall or if you hit your head (even if there is no bleeding).  Some medicines may interact with Xarelto and might increase your risk of bleeding while on Xarelto. To help avoid this, consult your healthcare provider or pharmacist prior to using any new prescription or non-prescription medications, including herbals, vitamins, non-steroidal anti-inflammatory drugs (NSAIDs) and supplements.  This website has more information on Xarelto: www.xarelto.com.   

## 2022-11-07 ENCOUNTER — Encounter (HOSPITAL_COMMUNITY): Payer: Self-pay | Admitting: Orthopedic Surgery

## 2022-11-07 DIAGNOSIS — Z853 Personal history of malignant neoplasm of breast: Secondary | ICD-10-CM | POA: Diagnosis not present

## 2022-11-07 DIAGNOSIS — Z85828 Personal history of other malignant neoplasm of skin: Secondary | ICD-10-CM | POA: Diagnosis not present

## 2022-11-07 DIAGNOSIS — Z79899 Other long term (current) drug therapy: Secondary | ICD-10-CM | POA: Diagnosis not present

## 2022-11-07 DIAGNOSIS — Z96641 Presence of right artificial hip joint: Secondary | ICD-10-CM | POA: Diagnosis not present

## 2022-11-07 DIAGNOSIS — Z87891 Personal history of nicotine dependence: Secondary | ICD-10-CM | POA: Diagnosis not present

## 2022-11-07 DIAGNOSIS — I10 Essential (primary) hypertension: Secondary | ICD-10-CM | POA: Diagnosis not present

## 2022-11-07 DIAGNOSIS — M1711 Unilateral primary osteoarthritis, right knee: Secondary | ICD-10-CM | POA: Diagnosis not present

## 2022-11-07 MED ORDER — METHOCARBAMOL 500 MG PO TABS: 500.0000 mg | ORAL_TABLET | Freq: Four times a day (QID) | ORAL | 0 refills | Status: AC | PRN

## 2022-11-07 MED ORDER — TRAMADOL HCL 50 MG PO TABS: 50.0000 mg | ORAL_TABLET | Freq: Four times a day (QID) | ORAL | 0 refills | Status: AC | PRN

## 2022-11-07 MED ORDER — ONDANSETRON HCL 4 MG PO TABS: 4.0000 mg | ORAL_TABLET | Freq: Four times a day (QID) | ORAL | 0 refills | Status: AC | PRN

## 2022-11-07 MED ORDER — OXYCODONE HCL 5 MG PO TABS: 5.0000 mg | ORAL_TABLET | Freq: Four times a day (QID) | ORAL | 0 refills | Status: AC | PRN

## 2022-11-07 MED ORDER — RIVAROXABAN 10 MG PO TABS: 10.0000 mg | ORAL_TABLET | Freq: Every day | ORAL | 0 refills | Status: AC

## 2022-11-07 NOTE — TOC Transition Note (Signed)
**Note Andrea-Identified via Obfuscation** Transition of Care [facility]) - CM/SW Discharge Note  Patient Details  Name: [name] MRN: [id number] Date of Birth: [birth year]  Transition of Care [facility]) CM/SW Contact:  Ewing Schlein, LCSW Phone Number: 11/07/2022, 10:57 AM  Clinical Narrative: Patient is expected to discharge home after passing PT. Patient will go home with OPPT at [facility] with the first visit scheduled for 11/09/22. Patient has a rolling walker, so there are no DME needs at this time. TOC signing off.    Final next level of care: OP Rehab Barriers to Discharge: No Barriers Identified  Patient Goals and CMS Choice Choice offered to / list presented to : NA  Discharge Plan and Services Additional resources added to the After Visit Summary for         DME Arranged: N/A DME Agency: NA HH Arranged: NA HH Agency: NA  Social Determinants of Health (SDOH) Interventions SDOH Screenings   Food Insecurity: No Food Insecurity (11/06/2022)  Housing: Low Risk  (11/06/2022)  Transportation Needs: No Transportation Needs (11/06/2022)  Utilities: Not At Risk (11/06/2022)  Tobacco Use: Medium Risk (11/06/2022)   Readmission Risk Interventions     No data to display

## 2022-11-07 NOTE — Progress Notes (Signed)
   Subjective: 1 Day Post-Op Procedure(s) (LRB): TOTAL KNEE ARTHROPLASTY (Right) Patient reports pain as mild.   Patient seen in rounds by Dr. Lequita Halt. Patient is well, and has had no acute complaints or problems No issues overnight. Denies chest pain, SOB, or calf pain. Foley catheter removed this AM.  We will continue therapy today, ambulated 20' yesterday.   Objective: Vital signs in last 24 hours: Temp:  [97.1 F (36.2 C)-98.1 F (36.7 C)] 98.1 F (36.7 C) (07/30 0616) Pulse Rate:  [51-72] 65 (07/30 0616) Resp:  [12-19] 16 (07/30 0616) BP: (102-171)/(58-88) 102/66 (07/30 0616) SpO2:  [94 %-100 %] 96 % (07/30 0616)  Intake/Output from previous day:  Intake/Output Summary (Last 24 hours) at 11/07/2022 0811 Last data filed at 11/07/2022 0600 Gross per 24 hour  Intake 3095.15 ml  Output 1440 ml  Net 1655.15 ml     Intake/Output this shift: No intake/output data recorded.  Labs: Recent Labs    11/07/22 0335  HGB 11.4*   Recent Labs    11/07/22 0335  WBC 12.1*  RBC 3.63*  HCT 35.1*  PLT 264   Recent Labs    11/07/22 0335  NA 137  K 3.9  CL 106  CO2 24  BUN 16  CREATININE 0.95  GLUCOSE 134*  CALCIUM 7.8*   No results for input(s): "LABPT", "INR" in the last 72 hours.  Exam: General - Patient is Alert and Oriented Extremity - Neurologically intact Neurovascular intact Sensation intact distally Dorsiflexion/Plantar flexion intact Dressing - dressing C/D/I Motor Function - intact, moving foot and toes well on exam.   Past Medical History:  Diagnosis Date   Arthritis    Cancer (HCC) 04/10/1990   right breast   Depression    mild   Dysrhythmia    "heart fluttering-been checked out, nothing wrong"   Goiter    small on right side, monitored   Hypercholesteremia    controlled by medicine   Hypertension    Melanoma (HCC)    Nose and chest    Assessment/Plan: 1 Day Post-Op Procedure(s) (LRB): TOTAL KNEE ARTHROPLASTY (Right) Principal  Problem:   OA (osteoarthritis) of knee Active Problems:   Primary osteoarthritis of right knee  Estimated body mass index is 23.59 kg/m as calculated from the following:   Height as of this encounter: 5\' 10"  (1.778 m).   Weight as of this encounter: 74.6 kg. Advance diet Up with therapy D/C IV fluids   Patient's anticipated LOS is less than 2 midnights, meeting these requirements: - Lives within 1 hour of care - Has a competent adult at home to recover with post-op recover - NO history of  - Chronic pain requiring opiods  - Diabetes  - Coronary Artery Disease  - Heart failure  - Heart attack  - Stroke  - DVT/VTE  - Cardiac arrhythmia  - Respiratory Failure/COPD  - Renal failure  - Anemia  - Advanced Liver disease  DVT Prophylaxis - Xarelto Weight bearing as tolerated. Continue therapy.  Plan is to go Home after hospital stay. Plan for discharge later today if progresses with therapy and meeting goals. Scheduled for OPPT at [facility]. Follow-up in the office in 2 weeks.  The PDMP database was reviewed today prior to any opioid medications being prescribed to this patient.  Arther Abbott, PA-C Orthopedic Surgery [phone] 11/07/2022, 8:11 AM

## 2022-11-07 NOTE — Progress Notes (Signed)
Physical Therapy Treatment Patient Details Name: Andrea Vasquez MRN: [id number] DOB: 1935/11/11 Today's Date: 11/07/2022   History of Present Illness 87 yo female presents to therapy s/p R TKA on 11/06/2022 due to failure of conservative measures. Pt PMH includes but is not limited to: GI bleed, anemia, HLD, HTN, hypokalemia, R ba ca, dysrhythmia, goiter, back surgery, and R THA.    PT Comments  POD # 1 am session Assisted OOB to amb in hallway and practice one step she has to enter her home.  Mod c/o nausea after session.  RN notified.  Will need another PT session before D/C to [facility] el[facility] today.    If plan is discharge home, recommend the following: A little help with walking and/or transfers;A little help with bathing/dressing/bathroom;Assistance with cooking/housework;Assist for transportation;Help with stairs or ramp for entrance   Can travel by private vehicle        Equipment Recommendations  None recommended by PT    Recommendations for Other Services       Precautions / Restrictions Precautions Precautions: Knee;Fall Precaution Comments: no pillow under knee Restrictions Weight Bearing Restrictions: No Other Position/Activity Restrictions: WBAT     Mobility  Bed Mobility Overal bed mobility: Needs Assistance Bed Mobility: Supine to Sit     Supine to sit: Supervision, Min guard     General bed mobility comments: demonstarted and instructed how to use strap    Transfers Overall transfer level: Needs assistance Equipment used: Rolling walker (2 wheels) Transfers: Sit to/from Stand Sit to Stand: Supervision, Min guard           General transfer comment: cues for proper UE placement and mod A to complete power up and cues for trunk extension    Ambulation/Gait Ambulation/Gait assistance: Supervision, Min guard Gait Distance (Feet): 34 Feet Assistive device: Rolling walker (2 wheels) Gait Pattern/deviations: Step-to pattern, Antalgic, Trunk  flexed Gait velocity: decreased     General Gait Details: VC's on proper sequencing as well as safety with turns.  Daughter present and observered.  Increased c/o feeling nausea after.  Reported to RN.   Stairs Stairs: Yes Stairs assistance: Min assist, Min guard Stair Management: No rails, Step to pattern, Forwards, With walker Number of Stairs: 1 General stair comments: VC's on proper walker placement as well as sequencing.  Daughter present and observered.   Wheelchair Mobility     Tilt Bed    Modified Rankin (Stroke Patients Only)       Balance                                            Cognition Arousal/Alertness: Awake/alert   Overall Cognitive Status: Within Functional Limits for tasks assessed                                 General Comments: AxO x 3 very pleasant and motivated with a very supportive Daughter        Exercises      General Comments        Pertinent Vitals/Pain Pain Assessment Pain Assessment: 0-10 Pain Score: 4  Pain Location: R knee Pain Descriptors / Indicators: Aching, Discomfort, Operative site guarding Pain Intervention(s): Monitored during session, Premedicated before session, Repositioned, Ice applied    Home Living  Prior Function            PT Goals (current goals can now be found in the care plan section) Progress towards PT goals: Progressing toward goals    Frequency           PT Plan Current plan remains appropriate    Co-evaluation              AM-PAC PT "6 Clicks" Mobility   Outcome Measure  Help needed turning from your back to your side while in a flat bed without using bedrails?: A Little Help needed moving from lying on your back to sitting on the side of a flat bed without using bedrails?: A Little Help needed moving to and from a bed to a chair (including a wheelchair)?: A Little Help needed standing up from a chair  using your arms (e.g., wheelchair or bedside chair)?: A Little Help needed to walk in hospital room?: A Little Help needed climbing 3-5 steps with a railing? : A Little 6 Click Score: 18    End of Session Equipment Utilized During Treatment: Gait belt Activity Tolerance: Patient limited by fatigue Patient left: in chair;with call bell/phone within reach;with chair alarm set Nurse Communication: Mobility status PT Visit Diagnosis: Unsteadiness on feet (R26.81);Other abnormalities of gait and mobility (R26.89);Muscle weakness (generalized) (M62.81);Pain Pain - part of body: Knee;Leg     Time: 0940-1005 PT Time Calculation (min) (ACUTE ONLY): 25 min  Charges:    $Gait Training: 8-22 mins $Therapeutic Activity: 8-22 mins PT General Charges $$ ACUTE PT VISIT: 1 Visit                     Felecia Shelling  PTA Acute  Rehabilitation Services Office M-F          [phone]

## 2022-11-07 NOTE — Progress Notes (Signed)
Physical Therapy Treatment Patient Details Name: Andrea Vasquez MRN: [id number] DOB: 1935/10/25 Today's Date: 11/07/2022   History of Present Illness 87 yo female presents to therapy s/p R TKA on 11/06/2022 due to failure of conservative measures. Pt PMH includes but is not limited to: GI bleed, anemia, HLD, HTN, hypokalemia, R ba ca, dysrhythmia, goiter, back surgery, and R THA.    PT Comments  POD # 1 pm session Pt feeling better after nausea med and a nap.  NT assisted pt to bathroom with no issues.  "Feeling better".  Assisted with amb in hallway and practice ONE step with Daughter "Hands On" using safety belt.  Then returned to room to perform some TE's following HEP handout.  Instructed on proper tech, freq as well as use of ICE.   Addressed all mobility questions, discussed appropriate activity, educated on use of ICE.  Pt ready for D/C to home.    If plan is discharge home, recommend the following: A little help with walking and/or transfers;A little help with bathing/dressing/bathroom;Assistance with cooking/housework;Assist for transportation;Help with stairs or ramp for entrance   Can travel by private vehicle        Equipment Recommendations  None recommended by PT    Recommendations for Other Services       Precautions / Restrictions Precautions Precautions: Knee;Fall Precaution Comments: no pillow under knee Restrictions Weight Bearing Restrictions: No Other Position/Activity Restrictions: WBAT     Mobility  Bed Mobility Overal bed mobility: Needs Assistance Bed Mobility: Supine to Sit     Supine to sit: Supervision, Min guard     General bed mobility comments: OOB in recliner    Transfers Overall transfer level: Needs assistance Equipment used: Rolling walker (2 wheels) Transfers: Sit to/from Stand Sit to Stand: Supervision           General transfer comment: had Daughter "hands on" assist Electrical engineer     Ambulation/Gait Ambulation/Gait assistance: Supervision, Min guard Gait Distance (Feet): 45 Feet Assistive device: Rolling walker (2 wheels) Gait Pattern/deviations: Step-to pattern, Antalgic, Trunk flexed Gait velocity: decreased     General Gait Details: Had Daughter "hands on" assist amb a functional distance.   Stairs Stairs: Yes Stairs assistance: Min assist, Min guard Stair Management: No rails, Step to pattern, Forwards, With walker Number of Stairs: 1 General stair comments: Had Daughter "hands on" assist pt up/down ONE step.  Both were able to correctly recall from this morning proper tech.   Wheelchair Mobility     Tilt Bed    Modified Rankin (Stroke Patients Only)       Balance                                            Cognition Arousal/Alertness: Awake/alert   Overall Cognitive Status: Within Functional Limits for tasks assessed                                 General Comments: AxO x 3 very pleasant and motivated with a very supportive Daughter        Exercises  Total Knee Replacement TE's following HEP handout 10 reps B LE ankle pumps 05 reps towel squeezes 05 reps knee presses 05 reps heel slides  05 reps SAQ's 05 reps SLR's 05 reps ABD Educated on use of gait  belt to assist with TE's Followed by ICE     General Comments        Pertinent Vitals/Pain Pain Assessment Pain Assessment: 0-10 Pain Score: 4  Pain Location: R knee Pain Descriptors / Indicators: Aching, Discomfort, Operative site guarding Pain Intervention(s): Monitored during session, Premedicated before session, Repositioned, Ice applied    Home Living                          Prior Function            PT Goals (current goals can now be found in the care plan section) Progress towards PT goals: Progressing toward goals    Frequency           PT Plan Current plan remains appropriate    Co-evaluation               AM-PAC PT "6 Clicks" Mobility   Outcome Measure  Help needed turning from your back to your side while in a flat bed without using bedrails?: A Little Help needed moving from lying on your back to sitting on the side of a flat bed without using bedrails?: A Little Help needed moving to and from a bed to a chair (including a wheelchair)?: A Little Help needed standing up from a chair using your arms (e.g., wheelchair or bedside chair)?: A Little Help needed to walk in hospital room?: A Little Help needed climbing 3-5 steps with a railing? : A Little 6 Click Score: 18    End of Session Equipment Utilized During Treatment: Gait belt Activity Tolerance: Patient limited by fatigue Patient left: in chair;with call bell/phone within reach;with chair alarm set Nurse Communication: Mobility status PT Visit Diagnosis: Unsteadiness on feet (R26.81);Other abnormalities of gait and mobility (R26.89);Muscle weakness (generalized) (M62.81);Pain Pain - part of body: Knee;Leg     Time: 1443-1510 PT Time Calculation (min) (ACUTE ONLY): 27 min  Charges:    $Gait Training: 8-22 mins $Therapeutic Exercise: 8-22 mins $Therapeutic Activity: 8-22 mins PT General Charges $$ ACUTE PT VISIT: 1 Visit                     Felecia Shelling  PTA Acute  Rehabilitation Services Office M-F          [phone]

## 2022-11-07 NOTE — Plan of Care (Signed)
  Problem: Education: Goal: Knowledge of the prescribed therapeutic regimen will improve Outcome: Progressing   Problem: Activity: Goal: Ability to avoid complications of mobility impairment will improve Outcome: Progressing Goal: Range of joint motion will improve Outcome: Adequate for Discharge   Problem: Clinical Measurements: Goal: Postoperative complications will be avoided or minimized Outcome: Progressing   Problem: Pain Management: Goal: Pain level will decrease with appropriate interventions Outcome: Progressing   Problem: Skin Integrity: Goal: Will show signs of wound healing Outcome: Progressing   Problem: Health Behavior/Discharge Planning: Goal: Ability to manage health-related needs will improve Outcome: Progressing   Problem: Clinical Measurements: Goal: Ability to maintain clinical measurements within normal limits will improve Outcome: Progressing Goal: Will remain free from infection Outcome: Progressing Goal: Diagnostic test results will improve Outcome: Progressing Goal: Respiratory complications will improve Outcome: Progressing Goal: Cardiovascular complication will be avoided Outcome: Progressing   Problem: Activity: Goal: Risk for activity intolerance will decrease Outcome: Adequate for Discharge   Problem: Nutrition: Goal: Adequate nutrition will be maintained Outcome: Completed/Met   Problem: Coping: Goal: Level of anxiety will decrease Outcome: Progressing   Problem: Elimination: Goal: Will not experience complications related to bowel motility Outcome: Progressing Goal: Will not experience complications related to urinary retention Outcome: Progressing   Problem: Pain Managment: Goal: General experience of comfort will improve Outcome: Adequate for Discharge   Problem: Safety: Goal: Ability to remain free from injury will improve Outcome: Progressing   Problem: Skin Integrity: Goal: Risk for impaired skin integrity will  decrease Outcome: Adequate for Discharge

## 2022-11-07 NOTE — Plan of Care (Signed)
Pt ready to DC home with family. 

## 2022-11-07 NOTE — Care Management Obs Status (Signed)
MEDICARE OBSERVATION STATUS NOTIFICATION   Patient Details  Name: Andrea Vasquez MRN: [id number] Date of Birth: 1936-03-06   Medicare Observation Status Notification Given:  Yes    Ewing Schlein, LCSW 11/07/2022, 1:30 PM

## 2022-11-08 NOTE — Discharge Summary (Signed)
Patient ID: Andrea Vasquez MRN: [id number] DOB/AGE: 85-24-1937 85 y.o.  Admit date: 11/06/2022 Discharge date: 11/07/2022  Admission Diagnoses:  Principal Problem:   OA (osteoarthritis) of knee Active Problems:   Primary osteoarthritis of right knee   Discharge Diagnoses:  Same  Past Medical History:  Diagnosis Date   Arthritis    Cancer (HCC) 04/10/1990   right breast   Depression    mild   Dysrhythmia    "heart fluttering-been checked out, nothing wrong"   Goiter    small on right side, monitored   Hypercholesteremia    controlled by medicine   Hypertension    Melanoma (HCC)    Nose and chest    Surgeries: Procedure(s): TOTAL KNEE ARTHROPLASTY on 11/06/2022   Consultants:   Discharged Condition: Improved  Hospital Course: Andrea Vasquez is an 87 y.o. female who was admitted 11/06/2022 for operative treatment ofOA (osteoarthritis) of knee. Patient has severe unremitting pain that affects sleep, daily activities, and work/hobbies. After pre-op clearance the patient was taken to the operating room on 11/06/2022 and underwent  Procedure(s): TOTAL KNEE ARTHROPLASTY.    Patient was given perioperative antibiotics:  Anti-infectives (From admission, onward)    Start     Dose/Rate Route Frequency Ordered Stop   11/06/22 1800  ceFAZolin (ANCEF) IVPB 2g/100 mL premix        2 g 200 mL/hr over 30 Minutes Intravenous Every 6 hours 11/06/22 1238 11/07/22 0300   11/06/22 0715  ceFAZolin (ANCEF) IVPB 2g/100 mL premix        2 g 200 mL/hr over 30 Minutes Intravenous On call to O.R. 11/06/22 0703 11/06/22 1015        Patient was given sequential compression devices, early ambulation, and chemoprophylaxis to prevent DVT.  Patient benefited maximally from hospital stay and there were no complications.    Recent vital signs: No data found.   Recent laboratory studies:  Recent Labs    11/07/22 0335  WBC 12.1*  HGB 11.4*  HCT 35.1*  PLT 264  NA 137  K 3.9  CL  106  CO2 24  BUN 16  CREATININE 0.95  GLUCOSE 134*  CALCIUM 7.8*     Discharge Medications:   Allergies as of 11/07/2022       Reactions   Aspirin    Caused Bleed         Medication List     STOP taking these medications    PRESERVISION AREDS PO       TAKE these medications    acetaminophen 325 MG tablet Commonly known as: TYLENOL Take 2 tablets (650 mg total) by mouth every 6 (six) hours as needed for mild pain (or Fever >/= 101).   alendronate 70 MG tablet Commonly known as: FOSAMAX Take 70 mg by mouth once a week. Sundays   methocarbamol 500 MG tablet Commonly known as: ROBAXIN Take 1 tablet (500 mg total) by mouth every 6 (six) hours as needed for muscle spasms.   mirtazapine 15 MG tablet Commonly known as: REMERON Take 15 mg by mouth at bedtime.   olmesartan-hydrochlorothiazide 40-25 MG tablet Commonly known as: BENICAR HCT Take 1 tablet by mouth daily.   ondansetron 4 MG tablet Commonly known as: ZOFRAN Take 1 tablet (4 mg total) by mouth every 6 (six) hours as needed for nausea.   oxyCODONE 5 MG immediate release tablet Commonly known as: Oxy IR/ROXICODONE Take 1-2 tablets (5-10 mg total) by mouth every 6 (six) hours as needed for  severe pain.   rivaroxaban 10 MG Tabs tablet Commonly known as: XARELTO Take 1 tablet (10 mg total) by mouth daily with breakfast for 20 days.   rosuvastatin 10 MG tablet Commonly known as: CRESTOR Take 10 mg by mouth every evening.   traMADol 50 MG tablet Commonly known as: ULTRAM Take 1-2 tablets (50-100 mg total) by mouth every 6 (six) hours as needed for moderate pain.               Discharge Care Instructions  (From admission, onward)           Start     Ordered   11/07/22 0000  Weight bearing as tolerated        11/07/22 0813   11/07/22 0000  Change dressing       Comments: You may remove the bulky bandage (ACE wrap and gauze) two days after surgery. You will have an adhesive waterproof  bandage underneath. Leave this in place until your first follow-up appointment.   11/07/22 0813            Diagnostic Studies: No results found.  Disposition: Discharge disposition: 01-Home or Self Care       Discharge Instructions     Call MD / Call 911   Complete by: As directed    If you experience chest pain or shortness of breath, CALL 911 and be transported to the hospital emergency room.  If you develope a fever above 101 F, pus (white drainage) or increased drainage or redness at the wound, or calf pain, call your surgeon's office.   Change dressing   Complete by: As directed    You may remove the bulky bandage (ACE wrap and gauze) two days after surgery. You will have an adhesive waterproof bandage underneath. Leave this in place until your first follow-up appointment.   Constipation Prevention   Complete by: As directed    Drink plenty of fluids.  Prune juice may be helpful.  You may use a stool softener, such as Colace (over the counter) 100 mg twice a day.  Use MiraLax (over the counter) for constipation as needed.   Diet - low sodium heart healthy   Complete by: As directed    Do not put a pillow under the knee. Place it under the heel.   Complete by: As directed    Driving restrictions   Complete by: As directed    No driving for two weeks   Post-operative opioid taper instructions:   Complete by: As directed    POST-OPERATIVE OPIOID TAPER INSTRUCTIONS: It is important to wean off of your opioid medication as soon as possible. If you do not need pain medication after your surgery it is ok to stop day one. Opioids include: Codeine, Hydrocodone(Norco, Vicodin), Oxycodone(Percocet, oxycontin) and hydromorphone amongst others.  Long term and even short term use of opiods can cause: Increased pain response Dependence Constipation Depression Respiratory depression And more.  Withdrawal symptoms can include Flu like symptoms Nausea, vomiting And  more Techniques to manage these symptoms Hydrate well Eat regular healthy meals Stay active Use relaxation techniques(deep breathing, meditating, yoga) Do Not substitute Alcohol to help with tapering If you have been on opioids for less than two weeks and do not have pain than it is ok to stop all together.  Plan to wean off of opioids This plan should start within one week post op of your joint replacement. Maintain the same interval or time between taking each dose and  first decrease the dose.  Cut the total daily intake of opioids by one tablet each day Next start to increase the time between doses. The last dose that should be eliminated is the evening dose.      TED hose   Complete by: As directed    Use stockings (TED hose) for three weeks on both leg(s).  You may remove them at night for sleeping.   Weight bearing as tolerated   Complete by: As directed         Follow-up Information     Ollen Gross, MD. Go on 11/22/2022.   Specialty: Orthopedic Surgery Why: You are scheduled for a follow up appointment on 11-22-22 at 2:30 pm. Contact information: [address] [place] [address] [phone]                  Signed: Arther Abbott 11/08/2022, 2:52 PM

## 2022-11-09 DIAGNOSIS — M25561 Pain in right knee: Secondary | ICD-10-CM | POA: Diagnosis not present

## 2022-11-21 DIAGNOSIS — M25661 Stiffness of right knee, not elsewhere classified: Secondary | ICD-10-CM | POA: Diagnosis not present

## 2022-11-21 DIAGNOSIS — M25561 Pain in right knee: Secondary | ICD-10-CM | POA: Diagnosis not present

## 2022-11-28 DIAGNOSIS — M25661 Stiffness of right knee, not elsewhere classified: Secondary | ICD-10-CM | POA: Diagnosis not present

## 2022-11-28 DIAGNOSIS — M25561 Pain in right knee: Secondary | ICD-10-CM | POA: Diagnosis not present

## 2022-11-30 DIAGNOSIS — M25661 Stiffness of right knee, not elsewhere classified: Secondary | ICD-10-CM | POA: Diagnosis not present

## 2022-11-30 DIAGNOSIS — M25561 Pain in right knee: Secondary | ICD-10-CM | POA: Diagnosis not present

## 2022-12-05 DIAGNOSIS — M25561 Pain in right knee: Secondary | ICD-10-CM | POA: Diagnosis not present

## 2022-12-05 DIAGNOSIS — M25661 Stiffness of right knee, not elsewhere classified: Secondary | ICD-10-CM | POA: Diagnosis not present

## 2022-12-07 DIAGNOSIS — M25561 Pain in right knee: Secondary | ICD-10-CM | POA: Diagnosis not present

## 2022-12-07 DIAGNOSIS — M25661 Stiffness of right knee, not elsewhere classified: Secondary | ICD-10-CM | POA: Diagnosis not present

## 2022-12-12 DIAGNOSIS — Z5189 Encounter for other specified aftercare: Secondary | ICD-10-CM | POA: Diagnosis not present

## 2023-02-06 DIAGNOSIS — H353131 Nonexudative age-related macular degeneration, bilateral, early dry stage: Secondary | ICD-10-CM | POA: Diagnosis not present

## 2023-02-06 DIAGNOSIS — H16223 Keratoconjunctivitis sicca, not specified as Sjogren's, bilateral: Secondary | ICD-10-CM | POA: Diagnosis not present

## 2023-02-06 DIAGNOSIS — Z961 Presence of intraocular lens: Secondary | ICD-10-CM | POA: Diagnosis not present

## 2023-03-22 DIAGNOSIS — E7800 Pure hypercholesterolemia, unspecified: Secondary | ICD-10-CM | POA: Diagnosis not present

## 2023-03-22 DIAGNOSIS — I10 Essential (primary) hypertension: Secondary | ICD-10-CM | POA: Diagnosis not present

## 2023-03-26 DIAGNOSIS — Z0000 Encounter for general adult medical examination without abnormal findings: Secondary | ICD-10-CM | POA: Diagnosis not present

## 2023-03-26 DIAGNOSIS — E7800 Pure hypercholesterolemia, unspecified: Secondary | ICD-10-CM | POA: Diagnosis not present

## 2023-03-26 DIAGNOSIS — F339 Major depressive disorder, recurrent, unspecified: Secondary | ICD-10-CM | POA: Diagnosis not present

## 2023-03-26 DIAGNOSIS — R2689 Other abnormalities of gait and mobility: Secondary | ICD-10-CM | POA: Diagnosis not present

## 2023-03-26 DIAGNOSIS — I10 Essential (primary) hypertension: Secondary | ICD-10-CM | POA: Diagnosis not present

## 2023-03-26 DIAGNOSIS — M810 Age-related osteoporosis without current pathological fracture: Secondary | ICD-10-CM | POA: Diagnosis not present

## 2023-05-09 DIAGNOSIS — Z1231 Encounter for screening mammogram for malignant neoplasm of breast: Secondary | ICD-10-CM | POA: Diagnosis not present

## 2023-08-14 DIAGNOSIS — H16223 Keratoconjunctivitis sicca, not specified as Sjogren's, bilateral: Secondary | ICD-10-CM | POA: Diagnosis not present

## 2023-08-14 DIAGNOSIS — Z961 Presence of intraocular lens: Secondary | ICD-10-CM | POA: Diagnosis not present

## 2023-08-14 DIAGNOSIS — H353131 Nonexudative age-related macular degeneration, bilateral, early dry stage: Secondary | ICD-10-CM | POA: Diagnosis not present

## 2023-12-18 DIAGNOSIS — D225 Melanocytic nevi of trunk: Secondary | ICD-10-CM | POA: Diagnosis not present

## 2023-12-18 DIAGNOSIS — L821 Other seborrheic keratosis: Secondary | ICD-10-CM | POA: Diagnosis not present

## 2023-12-18 DIAGNOSIS — L300 Nummular dermatitis: Secondary | ICD-10-CM | POA: Diagnosis not present

## 2023-12-18 DIAGNOSIS — M7130 Other bursal cyst, unspecified site: Secondary | ICD-10-CM | POA: Diagnosis not present

## 2023-12-18 DIAGNOSIS — Z85820 Personal history of malignant melanoma of skin: Secondary | ICD-10-CM | POA: Diagnosis not present

## 2023-12-18 DIAGNOSIS — D2272 Melanocytic nevi of left lower limb, including hip: Secondary | ICD-10-CM | POA: Diagnosis not present

## 2023-12-18 DIAGNOSIS — Z85828 Personal history of other malignant neoplasm of skin: Secondary | ICD-10-CM | POA: Diagnosis not present

## 2023-12-18 DIAGNOSIS — L570 Actinic keratosis: Secondary | ICD-10-CM | POA: Diagnosis not present

## 2024-01-22 DIAGNOSIS — H524 Presbyopia: Secondary | ICD-10-CM | POA: Diagnosis not present

## 2024-01-22 DIAGNOSIS — H353131 Nonexudative age-related macular degeneration, bilateral, early dry stage: Secondary | ICD-10-CM | POA: Diagnosis not present

## 2024-01-22 DIAGNOSIS — H16223 Keratoconjunctivitis sicca, not specified as Sjogren's, bilateral: Secondary | ICD-10-CM | POA: Diagnosis not present

## 2024-01-22 DIAGNOSIS — Z961 Presence of intraocular lens: Secondary | ICD-10-CM | POA: Diagnosis not present

## 2024-01-22 DIAGNOSIS — H52223 Regular astigmatism, bilateral: Secondary | ICD-10-CM | POA: Diagnosis not present

## 2024-01-22 DIAGNOSIS — H5203 Hypermetropia, bilateral: Secondary | ICD-10-CM | POA: Diagnosis not present
